# Patient Record
Sex: Female | Born: 1955 | Race: Black or African American | Hispanic: No | Smoking: Former smoker
Health system: Southern US, Community
[De-identification: ages and names within clinical notes are randomized; demographics above are authoritative.]

## PROBLEM LIST (undated history)

## (undated) DIAGNOSIS — J45909 Unspecified asthma, uncomplicated: Secondary | ICD-10-CM

## (undated) DIAGNOSIS — I10 Essential (primary) hypertension: Secondary | ICD-10-CM

## (undated) DIAGNOSIS — M1990 Unspecified osteoarthritis, unspecified site: Secondary | ICD-10-CM

## (undated) DIAGNOSIS — B1920 Unspecified viral hepatitis C without hepatic coma: Secondary | ICD-10-CM

## (undated) DIAGNOSIS — E119 Type 2 diabetes mellitus without complications: Secondary | ICD-10-CM

## (undated) DIAGNOSIS — J449 Chronic obstructive pulmonary disease, unspecified: Secondary | ICD-10-CM

## (undated) HISTORY — PX: TUBAL LIGATION: SHX77

## (undated) HISTORY — PX: ABDOMINAL HYSTERECTOMY: SHX81

---

## 1998-01-17 ENCOUNTER — Encounter: Admission: RE | Admit: 1998-01-17 | Discharge: 1998-01-17 | Payer: Self-pay | Admitting: Sports Medicine

## 1998-06-03 ENCOUNTER — Emergency Department (HOSPITAL_COMMUNITY): Admission: EM | Admit: 1998-06-03 | Discharge: 1998-06-03 | Payer: Self-pay | Admitting: Emergency Medicine

## 1998-06-04 ENCOUNTER — Emergency Department (HOSPITAL_COMMUNITY): Admission: EM | Admit: 1998-06-04 | Discharge: 1998-06-04 | Payer: Self-pay | Admitting: Emergency Medicine

## 1998-06-07 ENCOUNTER — Emergency Department (HOSPITAL_COMMUNITY): Admission: EM | Admit: 1998-06-07 | Discharge: 1998-06-07 | Payer: Self-pay | Admitting: Emergency Medicine

## 2000-10-28 ENCOUNTER — Emergency Department (HOSPITAL_COMMUNITY): Admission: EM | Admit: 2000-10-28 | Discharge: 2000-10-28 | Payer: Self-pay | Admitting: Emergency Medicine

## 2000-11-01 ENCOUNTER — Encounter: Admission: RE | Admit: 2000-11-01 | Discharge: 2000-11-01 | Payer: Self-pay

## 2000-11-11 ENCOUNTER — Encounter: Payer: Self-pay | Admitting: Emergency Medicine

## 2000-11-11 ENCOUNTER — Emergency Department (HOSPITAL_COMMUNITY): Admission: EM | Admit: 2000-11-11 | Discharge: 2000-11-11 | Payer: Self-pay | Admitting: Emergency Medicine

## 2000-11-18 ENCOUNTER — Emergency Department (HOSPITAL_COMMUNITY): Admission: EM | Admit: 2000-11-18 | Discharge: 2000-11-18 | Payer: Self-pay | Admitting: Emergency Medicine

## 2001-01-30 ENCOUNTER — Encounter: Payer: Self-pay | Admitting: Emergency Medicine

## 2001-01-30 ENCOUNTER — Emergency Department (HOSPITAL_COMMUNITY): Admission: EM | Admit: 2001-01-30 | Discharge: 2001-01-30 | Payer: Self-pay | Admitting: Emergency Medicine

## 2005-04-10 ENCOUNTER — Ambulatory Visit: Payer: Self-pay | Admitting: Internal Medicine

## 2005-05-01 ENCOUNTER — Ambulatory Visit (HOSPITAL_COMMUNITY): Admission: RE | Admit: 2005-05-01 | Discharge: 2005-05-01 | Payer: Self-pay | Admitting: Internal Medicine

## 2005-05-29 ENCOUNTER — Ambulatory Visit: Payer: Self-pay | Admitting: Gastroenterology

## 2005-12-06 ENCOUNTER — Ambulatory Visit: Payer: Self-pay | Admitting: Gastroenterology

## 2006-01-08 ENCOUNTER — Ambulatory Visit: Payer: Self-pay | Admitting: Gastroenterology

## 2014-07-07 DIAGNOSIS — Z1231 Encounter for screening mammogram for malignant neoplasm of breast: Secondary | ICD-10-CM | POA: Insufficient documentation

## 2019-10-18 DIAGNOSIS — E119 Type 2 diabetes mellitus without complications: Secondary | ICD-10-CM | POA: Insufficient documentation

## 2019-10-18 DIAGNOSIS — I10 Essential (primary) hypertension: Secondary | ICD-10-CM | POA: Insufficient documentation

## 2019-10-31 DIAGNOSIS — Z90711 Acquired absence of uterus with remaining cervical stump: Secondary | ICD-10-CM | POA: Insufficient documentation

## 2019-10-31 DIAGNOSIS — Z8619 Personal history of other infectious and parasitic diseases: Secondary | ICD-10-CM | POA: Insufficient documentation

## 2020-03-03 DIAGNOSIS — J310 Chronic rhinitis: Secondary | ICD-10-CM | POA: Insufficient documentation

## 2020-03-03 DIAGNOSIS — B351 Tinea unguium: Secondary | ICD-10-CM | POA: Insufficient documentation

## 2020-03-03 DIAGNOSIS — R059 Cough, unspecified: Secondary | ICD-10-CM | POA: Insufficient documentation

## 2020-03-03 DIAGNOSIS — R0600 Dyspnea, unspecified: Secondary | ICD-10-CM | POA: Insufficient documentation

## 2020-03-03 DIAGNOSIS — R0602 Shortness of breath: Secondary | ICD-10-CM | POA: Insufficient documentation

## 2020-03-24 DIAGNOSIS — J432 Centrilobular emphysema: Secondary | ICD-10-CM | POA: Insufficient documentation

## 2020-05-21 ENCOUNTER — Encounter (HOSPITAL_COMMUNITY): Payer: Self-pay | Admitting: Emergency Medicine

## 2020-05-21 ENCOUNTER — Emergency Department (HOSPITAL_COMMUNITY): Payer: BLUE CROSS/BLUE SHIELD

## 2020-05-21 ENCOUNTER — Emergency Department (HOSPITAL_COMMUNITY)
Admission: EM | Admit: 2020-05-21 | Discharge: 2020-05-21 | Disposition: A | Discharge status: Home / Self Care | Payer: BLUE CROSS/BLUE SHIELD | Attending: Emergency Medicine | Admitting: Emergency Medicine

## 2020-05-21 ENCOUNTER — Other Ambulatory Visit: Payer: Self-pay

## 2020-05-21 DIAGNOSIS — R1013 Epigastric pain: Secondary | ICD-10-CM | POA: Diagnosis not present

## 2020-05-21 DIAGNOSIS — R109 Unspecified abdominal pain: Secondary | ICD-10-CM | POA: Diagnosis present

## 2020-05-21 DIAGNOSIS — K8020 Calculus of gallbladder without cholecystitis without obstruction: Secondary | ICD-10-CM | POA: Diagnosis not present

## 2020-05-21 LAB — CBC WITH DIFFERENTIAL/PLATELET
Abs Immature Granulocytes: 0.02 10*3/uL (ref 0.00–0.07)
Basophils Absolute: 0 10*3/uL (ref 0.0–0.1)
Basophils Relative: 1 %
Eosinophils Absolute: 0.4 10*3/uL (ref 0.0–0.5)
Eosinophils Relative: 7 %
HCT: 44.2 % (ref 36.0–46.0)
Hemoglobin: 13.9 g/dL (ref 12.0–15.0)
Immature Granulocytes: 0 %
Lymphocytes Relative: 28 %
Lymphs Abs: 1.5 10*3/uL (ref 0.7–4.0)
MCH: 27.9 pg (ref 26.0–34.0)
MCHC: 31.4 g/dL (ref 30.0–36.0)
MCV: 88.8 fL (ref 80.0–100.0)
Monocytes Absolute: 0.5 10*3/uL (ref 0.1–1.0)
Monocytes Relative: 9 %
Neutro Abs: 2.9 10*3/uL (ref 1.7–7.7)
Neutrophils Relative %: 55 %
Platelets: 199 10*3/uL (ref 150–400)
RBC: 4.98 MIL/uL (ref 3.87–5.11)
RDW: 13.1 % (ref 11.5–15.5)
WBC: 5.3 10*3/uL (ref 4.0–10.5)
nRBC: 0 % (ref 0.0–0.2)

## 2020-05-21 LAB — COMPREHENSIVE METABOLIC PANEL
ALT: 13 U/L (ref 0–44)
AST: 18 U/L (ref 15–41)
Albumin: 4.1 g/dL (ref 3.5–5.0)
Alkaline Phosphatase: 50 U/L (ref 38–126)
Anion gap: 11 (ref 5–15)
BUN: 13 mg/dL (ref 8–23)
CO2: 24 mmol/L (ref 22–32)
Calcium: 9.2 mg/dL (ref 8.9–10.3)
Chloride: 102 mmol/L (ref 98–111)
Creatinine, Ser: 0.85 mg/dL (ref 0.44–1.00)
GFR calc Af Amer: >60 mL/min (ref >60)
GFR calc non Af Amer: >60 mL/min (ref >60)
Glucose, Bld: 193 mg/dL — ABNORMAL HIGH (ref 70–99)
Potassium: 3.6 mmol/L (ref 3.5–5.1)
Sodium: 137 mmol/L (ref 135–145)
Total Bilirubin: 1.2 mg/dL (ref 0.3–1.2)
Total Protein: 7.2 g/dL (ref 6.5–8.1)

## 2020-05-21 LAB — URINALYSIS, ROUTINE W REFLEX MICROSCOPIC
Bilirubin Urine: NEGATIVE
Glucose, UA: 150 mg/dL — AB
Hgb urine dipstick: NEGATIVE
Ketones, ur: NEGATIVE mg/dL
Leukocytes,Ua: NEGATIVE
Nitrite: NEGATIVE
Protein, ur: NEGATIVE mg/dL
Specific Gravity, Urine: 1.021 (ref 1.005–1.030)
pH: 6 (ref 5.0–8.0)

## 2020-05-21 LAB — LIPASE, BLOOD: Lipase: 30 U/L (ref 11–51)

## 2020-05-21 MED ORDER — SUCRALFATE 1 G PO TABS
1.0000 g | ORAL_TABLET | Freq: Three times a day (TID) | ORAL | 0 refills | Status: DC
Start: 2020-05-21 — End: 2022-02-06

## 2020-05-21 MED ORDER — SODIUM CHLORIDE 0.9 % IV BOLUS
1000.0000 mL | Freq: Once | INTRAVENOUS | Status: AC
  Administered 2020-05-21: 1000 mL via INTRAVENOUS

## 2020-05-21 MED ORDER — MORPHINE SULFATE (PF) 4 MG/ML IV SOLN
4.0000 mg | Freq: Once | INTRAVENOUS | Status: AC
  Administered 2020-05-21: 4 mg via INTRAVENOUS
  Filled 2020-05-21: qty 1

## 2020-05-21 MED ORDER — ONDANSETRON 4 MG PO TBDP
4.0000 mg | ORAL_TABLET | Freq: Once | ORAL | Status: AC
  Administered 2020-05-21: 4 mg via ORAL
  Filled 2020-05-21: qty 1

## 2020-05-21 MED ORDER — ONDANSETRON HCL 4 MG/2ML IJ SOLN
4.0000 mg | Freq: Once | INTRAMUSCULAR | Status: AC
  Administered 2020-05-21: 4 mg via INTRAVENOUS
  Filled 2020-05-21: qty 2

## 2020-05-21 MED ORDER — OMEPRAZOLE 20 MG PO CPDR
20.0000 mg | DELAYED_RELEASE_CAPSULE | Freq: Two times a day (BID) | ORAL | 0 refills | Status: AC
Start: 2020-05-21 — End: ?

## 2020-05-21 MED ORDER — ALUM & MAG HYDROXIDE-SIMETH 200-200-20 MG/5ML PO SUSP
30.0000 mL | Freq: Once | ORAL | Status: AC
  Administered 2020-05-21: 30 mL via ORAL
  Filled 2020-05-21: qty 30

## 2020-05-21 MED ORDER — IOHEXOL 300 MG/ML  SOLN
100.0000 mL | Freq: Once | INTRAMUSCULAR | Status: AC | PRN
  Administered 2020-05-21: 100 mL via INTRAVENOUS

## 2020-05-21 NOTE — ED Provider Notes (Signed)
[facility] EMERGENCY DEPARTMENT Provider Note   CSN: [id number] Arrival date & time: 05/21/20  8099     History No chief complaint on file.   Kathleen Orr is a 64 y.o. female.  The history is provided by the patient. No language interpreter was used.     64 year old female presenting for evaluation of abdominal pain.  Patient report last night she ate fried food, around 10 PM when she went to sleep she developed pain to her upper abdomen.  She described pain as a burning aching sensation in her upper abdomen radiates towards her throat.  Pain is been waxing waning, intense, with associated nausea.  She also endorsed vomiting copious amount of time with yellow emesis.  She tries having bowel movement without any success.  She endorsed some chills while vomiting.  She does not complain of any fever, headache, lightheadedness, dizziness, chest pain, shortness of breath, productive cough, hemoptysis, back pain, dysuria.  Denies any exertional chest pain.  She tries drinking ginger ale without relief.  When arrived to the ER and she received antinausea medication her nausea has improved.  She has an intact gallbladder.  She denies alcohol or tobacco use.  She denies any significant cardiac history.  She is up-to-date with her Covid vaccination.  Currently rates the pain a 6 out of 10.  No past medical history on file.  There are no problems to display for this patient.   The histories are not reviewed yet. Please review them in the "History" navigator section and refresh this SmartLink.   OB History   No obstetric history on file.     No family history on file.  Social History   Tobacco Use  . Smoking status: Never Smoker  Substance Use Topics  . Alcohol use: Not on file  . Drug use: Not on file    Home Medications Prior to Admission medications   Not on File    Allergies    Patient has no allergy information on record.  Review of Systems   Review of  Systems  All other systems reviewed and are negative.   Physical Exam Updated Vital Signs BP (!) 152/79 (BP Location: Right Arm)   Pulse 66   Temp 97.6 F (36.4 C) (Oral)   Resp 16   Ht 5\' 5"  (1.651 m)   Wt 91.6 kg   SpO2 100%   BMI 33.61 kg/m   Physical Exam Vitals and nursing note reviewed.  Constitutional:      General: She is not in acute distress.    Appearance: She is well-developed.  HENT:     Head: Atraumatic.  Eyes:     Conjunctiva/sclera: Conjunctivae normal.  Cardiovascular:     Rate and Rhythm: Normal rate and regular rhythm.     Pulses: Normal pulses.     Heart sounds: Normal heart sounds.  Pulmonary:     Effort: Pulmonary effort is normal.     Breath sounds: Normal breath sounds.  Abdominal:     General: Abdomen is flat.     Palpations: Abdomen is soft.     Tenderness: There is abdominal tenderness (Tenderness to epigastric and across upper abdomen on palpation without guarding or rebound tenderness.).     Comments: No pain at McBurney's point.  Musculoskeletal:     Cervical back: Neck supple.  Skin:    Findings: No rash.  Neurological:     Mental Status: She is alert.     ED Results /  Procedures / Treatments   Labs (all labs ordered are listed, but only abnormal results are displayed) Labs Reviewed  COMPREHENSIVE METABOLIC PANEL - Abnormal; Notable for the following components:      Result Value   Glucose, Bld 193 (*)    All other components within normal limits  URINALYSIS, ROUTINE W REFLEX MICROSCOPIC - Abnormal; Notable for the following components:   Glucose, UA 150 (*)    All other components within normal limits  LIPASE, BLOOD  CBC WITH DIFFERENTIAL/PLATELET    EKG None   Date: 05/21/2020  Rate: 68  Rhythm: normal sinus rhythm  QRS Axis: normal  Intervals: normal  ST/T Wave abnormalities: normal  Conduction Disutrbances: none  Narrative Interpretation:   Old EKG Reviewed: No significant changes noted EKG reviewed and  interpreted by me     Radiology CT ABDOMEN PELVIS W CONTRAST  Result Date: 05/21/2020 CLINICAL DATA:  Epigastric and left-sided abdominal pain for 1 day EXAM: CT ABDOMEN AND PELVIS WITH CONTRAST TECHNIQUE: Multidetector CT imaging of the abdomen and pelvis was performed using the standard protocol following bolus administration of intravenous contrast. CONTRAST:  OMNIPAQUE IOHEXOL 300 MG/ML  SOLN COMPARISON:  Same-day ultrasound FINDINGS: Lower chest: No acute abnormality. Hepatobiliary: Multiple layering stones present within the gallbladder lumen. No pericholecystic inflammatory changes by CT. Liver appears unremarkable. No biliary dilatation. Pancreas: Unremarkable. No pancreatic ductal dilatation or surrounding inflammatory changes. Spleen: Normal in size without focal abnormality. Adrenals/Urinary Tract: Unremarkable adrenal glands. Kidneys enhance symmetrically without focal lesion, stone, or hydronephrosis. Ureters are nondilated. Urinary bladder appears unremarkable. Stomach/Bowel: Small sliding type hiatal hernia. Stomach is within normal limits. No dilated loops of bowel to suggest obstruction. Unremarkable retrocecal appendix. There are a few scattered sigmoid diverticula. No focal bowel wall thickening. No pericolonic inflammatory changes. Vascular/Lymphatic: Scattered aortoiliac atherosclerotic calcifications without aneurysm. No abdominopelvic lymphadenopathy. Reproductive: No mass or other abnormality. Other: No free fluid. No abdominopelvic fluid collection. No pneumoperitoneum. Musculoskeletal: No acute or significant osseous findings. IMPRESSION: 1. No acute abdominopelvic findings. 2. Cholelithiasis. 3. Scattered sigmoid diverticulosis without evidence of acute diverticulitis. 4. Small sliding type hiatal hernia. 5. Aortic atherosclerosis. (ICD10-I70.0). Electronically Signed   By: Duanne Guess D.O.   On: 05/21/2020 12:45   US Abdomen Limited  Result Date: 05/21/2020 CLINICAL  DATA:  Abdominal pain, nausea/vomiting x2 days EXAM: ULTRASOUND ABDOMEN LIMITED RIGHT UPPER QUADRANT COMPARISON:  None. FINDINGS: Gallbladder: Multiple gallstones measuring up to 16 mm. No gallbladder wall thickening or pericholecystic fluid. Negative sonographic Murphy's sign. Common bile duct: Diameter: 4 mm Liver: No focal lesion identified. Within normal limits in parenchymal echogenicity. Portal vein is patent on color Doppler imaging with normal direction of blood flow towards the liver. Other: None. IMPRESSION: Cholelithiasis, without associated sonographic findings to suggest acute cholecystitis. Electronically Signed   By: Charline Bills M.D.   On: 05/21/2020 10:43    Procedures Procedures (including critical care time)  Medications Ordered in ED Medications  ondansetron (ZOFRAN-ODT) disintegrating tablet 4 mg (4 mg Oral Given 05/21/20 0550)  alum & mag hydroxide-simeth (MAALOX/MYLANTA) 200-200-20 MG/5ML suspension 30 mL (30 mLs Oral Given 05/21/20 1139)  sodium chloride 0.9 % bolus 1,000 mL (1,000 mLs Intravenous New Bag/Given 05/21/20 1138)  morphine 4 MG/ML injection 4 mg (4 mg Intravenous Given 05/21/20 1144)  ondansetron (ZOFRAN) injection 4 mg (4 mg Intravenous Given 05/21/20 1144)  iohexol (OMNIPAQUE) 300 MG/ML solution 100 mL (100 mLs Intravenous Contrast Given 05/21/20 1154)    ED Course  I have reviewed the  triage vital signs and the nursing notes.  Pertinent labs & imaging results that were available during my care of the patient were reviewed by me and considered in my medical decision making (see chart for details).    MDM Rules/Calculators/A&P                          BP 139/72   Pulse 90   Temp 97.6 F (36.4 C) (Oral)   Resp (!) 23   Ht 5\' 5"  (1.651 m)   Wt 91.6 kg   SpO2 100%   BMI 33.61 kg/m   Final Clinical Impression(s) / ED Diagnoses Final diagnoses:  Epigastric pain  Calculus of gallbladder without cholecystitis without obstruction    Rx / DC Orders ED  Discharge Orders    None     9:26 AM Patient here with upper abdominal pain.  Symptoms suggestive of gastritis.  Possibility of gallbladder etiology as well.  Will obtain limited abdominal ultrasound and will give GI cocktail as well as IV fluid and antinausea medication. Low suspicion for ACS however EKG ordered.  11:03 AM Labs are mostly reassuring, mild hyperglycemia with a CBG of 193.  A limited abdominal ultrasound demonstrated cholelithiasis without evidence of acute cholecystitis.  I suspect her symptoms may likely be gastritis versus potential biliary disease however on reexamination the abdominal pain seems to be more widespread and given her age, and not a definitive finding on her ultrasound, I will obtain an abdominal pelvis CT scan.  1:17 PM Abdominal and pelvis CT scan without any acute abdominal pelvic findings.  Evidence of cholelithiasis as well as scattered sigmoid diverticulosis without evidence of acute diverticulitis.  Small sliding-type hiatal hernia.  At this time patient felt much better with treatment and stable enough to go home.  Encourage patient to avoid fatty greasy food and recommend patient to follow-up with general surgery for discussion of further treatment of cholelithiasis.  Medication prescribed for potential gastritis causing her discomfort.  Return precaution discussed.  Patient voiced understanding and agrees with plan.   , PA-C 05/21/20 1319    07/21/20, MD 05/21/20 [phone]

## 2020-05-21 NOTE — Discharge Instructions (Signed)
Your abdominal pain may be due to to gallstone.  Please avoid eating greasy fatty food as it may precipitate additional pain.  Call and follow-up with surgery for further evaluation.  Take medication prescribed.  Return to the ER if your condition worsen or if you have any other concern.

## 2020-05-21 NOTE — ED Triage Notes (Signed)
Pt here with c/o abd pain that started last night epigastric and to the left side

## 2020-05-30 ENCOUNTER — Encounter (HOSPITAL_COMMUNITY): Payer: Self-pay

## 2020-05-30 ENCOUNTER — Emergency Department (HOSPITAL_COMMUNITY)
Admission: EM | Admit: 2020-05-30 | Discharge: 2020-05-31 | Disposition: A | Discharge status: Home / Self Care | Payer: BLUE CROSS/BLUE SHIELD | Attending: Emergency Medicine | Admitting: Emergency Medicine

## 2020-05-30 ENCOUNTER — Other Ambulatory Visit: Payer: Self-pay

## 2020-05-30 ENCOUNTER — Emergency Department (HOSPITAL_COMMUNITY): Payer: BLUE CROSS/BLUE SHIELD

## 2020-05-30 DIAGNOSIS — Z79899 Other long term (current) drug therapy: Secondary | ICD-10-CM | POA: Insufficient documentation

## 2020-05-30 DIAGNOSIS — R062 Wheezing: Secondary | ICD-10-CM | POA: Insufficient documentation

## 2020-05-30 DIAGNOSIS — R05 Cough: Secondary | ICD-10-CM | POA: Insufficient documentation

## 2020-05-30 DIAGNOSIS — R0602 Shortness of breath: Secondary | ICD-10-CM | POA: Diagnosis not present

## 2020-05-30 DIAGNOSIS — Z20822 Contact with and (suspected) exposure to covid-19: Secondary | ICD-10-CM | POA: Diagnosis not present

## 2020-05-30 LAB — CBC
HCT: 45.4 % (ref 36.0–46.0)
Hemoglobin: 14.3 g/dL (ref 12.0–15.0)
MCH: 28.5 pg (ref 26.0–34.0)
MCHC: 31.5 g/dL (ref 30.0–36.0)
MCV: 90.4 fL (ref 80.0–100.0)
Platelets: 219 10*3/uL (ref 150–400)
RBC: 5.02 MIL/uL (ref 3.87–5.11)
RDW: 13.2 % (ref 11.5–15.5)
WBC: 6.8 10*3/uL (ref 4.0–10.5)
nRBC: 0 % (ref 0.0–0.2)

## 2020-05-30 LAB — TROPONIN I (HIGH SENSITIVITY)
Troponin I (High Sensitivity): 3 ng/L (ref <18)
Troponin I (High Sensitivity): 3 ng/L (ref <18)

## 2020-05-30 LAB — BASIC METABOLIC PANEL
Anion gap: 9 (ref 5–15)
BUN: 11 mg/dL (ref 8–23)
CO2: 29 mmol/L (ref 22–32)
Calcium: 9.5 mg/dL (ref 8.9–10.3)
Chloride: 103 mmol/L (ref 98–111)
Creatinine, Ser: 0.89 mg/dL (ref 0.44–1.00)
GFR calc Af Amer: >60 mL/min (ref >60)
GFR calc non Af Amer: >60 mL/min (ref >60)
Glucose, Bld: 105 mg/dL — ABNORMAL HIGH (ref 70–99)
Potassium: 4.2 mmol/L (ref 3.5–5.1)
Sodium: 141 mmol/L (ref 135–145)

## 2020-05-30 LAB — SARS CORONAVIRUS 2 BY RT PCR (HOSPITAL ORDER, PERFORMED IN ~~LOC~~ HOSPITAL LAB): SARS Coronavirus 2: NEGATIVE

## 2020-05-30 MED ORDER — ALBUTEROL SULFATE HFA 108 (90 BASE) MCG/ACT IN AERS
2.0000 | INHALATION_SPRAY | Freq: Once | RESPIRATORY_TRACT | Status: AC
  Administered 2020-05-30: 2 via RESPIRATORY_TRACT
  Filled 2020-05-30: qty 6.7

## 2020-05-30 NOTE — ED Triage Notes (Signed)
Pt arrives to ED w/ c/o SOB, cough since Friday. Pt denies fever, states she is fully vaccinated against covid. Auditory wheezing noted. Pt denies chest pain.

## 2020-05-31 ENCOUNTER — Emergency Department (HOSPITAL_COMMUNITY): Payer: BLUE CROSS/BLUE SHIELD

## 2020-05-31 LAB — D-DIMER, QUANTITATIVE: D-Dimer, Quant: 1.35 ug/mL-FEU — ABNORMAL HIGH (ref 0.00–0.50)

## 2020-05-31 MED ORDER — PREDNISONE 20 MG PO TABS: 60.0000 mg | ORAL_TABLET | Freq: Once | ORAL | Status: DC

## 2020-05-31 MED ORDER — METHYLPREDNISOLONE SODIUM SUCC 125 MG IJ SOLR
125.0000 mg | Freq: Once | INTRAMUSCULAR | Status: DC
  Filled 2020-05-31: qty 2

## 2020-05-31 MED ORDER — DEXAMETHASONE SODIUM PHOSPHATE 10 MG/ML IJ SOLN
10.0000 mg | Freq: Once | INTRAMUSCULAR | Status: AC
  Administered 2020-05-31: 10 mg via INTRAVENOUS
  Filled 2020-05-31: qty 1

## 2020-05-31 MED ORDER — IPRATROPIUM-ALBUTEROL 0.5-2.5 (3) MG/3ML IN SOLN
3.0000 mL | Freq: Once | RESPIRATORY_TRACT | Status: AC
  Administered 2020-05-31: 3 mL via RESPIRATORY_TRACT
  Filled 2020-05-31: qty 3

## 2020-05-31 MED ORDER — ACETAMINOPHEN 325 MG PO TABS
650.0000 mg | ORAL_TABLET | Freq: Once | ORAL | Status: AC
  Administered 2020-05-31: 650 mg via ORAL
  Filled 2020-05-31: qty 2

## 2020-05-31 MED ORDER — IOHEXOL 350 MG/ML SOLN
75.0000 mL | Freq: Once | INTRAVENOUS | Status: AC | PRN
  Administered 2020-05-31: 75 mL via INTRAVENOUS

## 2020-05-31 NOTE — Discharge Instructions (Addendum)
Your work-up today was overall reassuring.  Please make sure to follow-up with your lung doctor about the symptoms you are experiencing today.  Return to the ER if your symptoms worsen.

## 2020-05-31 NOTE — ED Provider Notes (Signed)
[facility] EMERGENCY DEPARTMENT Provider Note   CSN: [id number] Arrival date & time: 05/30/20  1345     History Chief Complaint  Patient presents with  . Shortness of Breath    Kathleen Orr is a 64 y.o. female.  HPI 64 year old with several days of cough and shortness of breath.  Patient states that she has gradually started to have more wheezing and shortness of breath since Friday.  Endorses smoking, but has not smoked for the last 14 years.  Per chart review, seen a pulmonologist and had PFTs performed.  Given albuterol, states that she has been taking it but recently ran out.  States she picked up a refill yesterday.  She has been vaccinated for Covid.  No fevers or chills.  No chest pain.  No abdominal pain, nausea, vomiting, headache.  No known sick contacts.  Has recently traveled to [place] by car, not on supplemental estrogen.  No history of autoimmune disease.    History reviewed. No pertinent past medical history.  There are no problems to display for this patient.   History reviewed. No pertinent surgical history.   OB History   No obstetric history on file.     No family history on file.  Social History   Tobacco Use  . Smoking status: Never Smoker  Substance Use Topics  . Alcohol use: Not on file  . Drug use: Not on file    Home Medications Prior to Admission medications   Medication Sig Start Date End Date Taking? Authorizing Provider  Fluticasone Propionate (FLONASE ALLERGY RELIEF NA) Place 1 spray into the nose daily as needed.   Yes [provider]  hydrochlorothiazide (HYDRODIURIL) 25 MG tablet Take 25 mg by mouth daily.   Yes [provider]  ibuprofen (ADVIL) 800 MG tablet Take 800 mg by mouth daily as needed.   Yes [provider]  omeprazole (PRILOSEC) 20 MG capsule Take 1 capsule (20 mg total) by mouth 2 (two) times daily before a meal. 05/21/20  Yes Fayrene Helper, PA-C  sucralfate (CARAFATE) 1 g  tablet Take 1 tablet (1 g total) by mouth 4 (four) times daily -  with meals and at bedtime. Patient not taking: Reported on 05/31/2020 05/21/20   Fayrene Helper, PA-C    Allergies    Patient has no allergy information on record.  Review of Systems   Review of Systems  Constitutional: Negative for chills and fever.  HENT: Negative for ear pain and sore throat.   Eyes: Negative for pain and visual disturbance.  Respiratory: Positive for cough, shortness of breath and wheezing.   Cardiovascular: Negative for chest pain and palpitations.  Gastrointestinal: Negative for abdominal pain and vomiting.  Genitourinary: Negative for dysuria and hematuria.  Musculoskeletal: Negative for arthralgias and back pain.  Skin: Negative for color change and rash.  Neurological: Negative for seizures and syncope.  All other systems reviewed and are negative.   Physical Exam Updated Vital Signs BP (!) 155/89   Pulse 76   Temp 98.6 F (37 C) (Oral)   Resp 18   Ht 5\' 5"  (1.651 m)   Wt 91.6 kg   SpO2 95%   BMI 33.60 kg/m   Physical Exam Vitals and nursing note reviewed.  Constitutional:      General: She is not in acute distress.    Appearance: She is well-developed. She is not ill-appearing, toxic-appearing or diaphoretic.  HENT:     Head: Normocephalic and atraumatic.  Eyes:  Conjunctiva/sclera: Conjunctivae normal.  Cardiovascular:     Rate and Rhythm: Normal rate and regular rhythm.     Heart sounds: No murmur heard.   Pulmonary:     Effort: Tachypnea and accessory muscle usage present. No respiratory distress.     Breath sounds: Decreased breath sounds and wheezing present. No rhonchi.  Chest:     Chest wall: No tenderness.  Abdominal:     Palpations: Abdomen is soft.     Tenderness: There is no abdominal tenderness.  Musculoskeletal:     Cervical back: Neck supple.     Right lower leg: No tenderness. No edema.     Left lower leg: No tenderness. No edema.     Comments: Calves  normal bilaterally, no unilateral swelling or redness   Skin:    General: Skin is warm and dry.     Capillary Refill: Capillary refill takes less than 2 seconds.  Neurological:     General: No focal deficit present.     Mental Status: She is alert.  Psychiatric:        Mood and Affect: Mood normal.        Behavior: Behavior normal.     ED Results / Procedures / Treatments   Labs (all labs ordered are listed, but only abnormal results are displayed) Labs Reviewed  BASIC METABOLIC PANEL - Abnormal; Notable for the following components:      Result Value   Glucose, Bld 105 (*)    All other components within normal limits  D-DIMER, QUANTITATIVE (NOT AT [facility]) - Abnormal; Notable for the following components:   D-Dimer, Quant 1.35 (*)    All other components within normal limits  SARS CORONAVIRUS 2 BY RT PCR (HOSPITAL ORDER, PERFORMED IN [facility])  CBC  TROPONIN I (HIGH SENSITIVITY)  TROPONIN I (HIGH SENSITIVITY)    EKG None  Radiology CT Angio Chest PE W and/or Wo Contrast  Result Date: 05/31/2020 CLINICAL DATA:  Shortness of breath, positive D-dimer EXAM: CT ANGIOGRAPHY CHEST WITH CONTRAST TECHNIQUE: Multidetector CT imaging of the chest was performed using the standard protocol during bolus administration of intravenous contrast. Multiplanar CT image reconstructions and MIPs were obtained to evaluate the vascular anatomy. CONTRAST:  77mL OMNIPAQUE IOHEXOL 350 MG/ML SOLN COMPARISON:  None. FINDINGS: Cardiovascular: There is motion artifact particularly at the lung bases. Satisfactory opacification of some of the pulmonary arteries to the segmental level. Segmental branches at the lung bases are not well evaluated. No evidence of pulmonary embolism. Normal heart size. No pericardial effusion. Mediastinum/Nodes: No enlarged lymph nodes identified. Enlarged partially imaged thyroid. Lungs/Pleura: Centrilobular emphysema. No consolidation or mass. Mild bibasilar  subsegmental atelectasis. No pleural effusion or pneumothorax. Upper Abdomen: Small hiatal hernia. Musculoskeletal: No acute osseous abnormality. Review of the MIP images confirms the above findings. IMPRESSION: No evidence of acute pulmonary embolism within limitation of motion artifact. No other acute abnormality. Electronically Signed   By: Guadlupe Spanish M.D.   On: 05/31/2020 14:51   DG Chest Portable 1 View  Result Date: 05/30/2020 CLINICAL DATA:  Cough, wheezing, shortness of breath. EXAM: PORTABLE CHEST 1 VIEW COMPARISON:  Chest x-ray dated March 30, 2015. FINDINGS: The heart size and mediastinal contours are within normal limits. Both lungs are clear. The visualized skeletal structures are unremarkable. IMPRESSION: No active disease. Electronically Signed   By: Obie Dredge M.D.   On: 05/30/2020 14:30    Procedures Procedures (including critical care time)  Medications Ordered in ED Medications  albuterol (  VENTOLIN HFA) 108 (90 Base) MCG/ACT inhaler 2 puff (2 puffs Inhalation Given 05/30/20 1417)  ipratropium-albuterol (DUONEB) 0.5-2.5 (3) MG/3ML nebulizer solution 3 mL (3 mLs Nebulization Given 05/31/20 1012)  acetaminophen (TYLENOL) tablet 650 mg (650 mg Oral Given 05/31/20 1138)  dexamethasone (DECADRON) injection 10 mg (10 mg Intravenous Given 05/31/20 1138)  iohexol (OMNIPAQUE) 350 MG/ML injection 75 mL (75 mLs Intravenous Contrast Given 05/31/20 1418)  ipratropium-albuterol (DUONEB) 0.5-2.5 (3) MG/3ML nebulizer solution 3 mL (3 mLs Nebulization Given 05/31/20 1547)    ED Course  I have reviewed the triage vital signs and the nursing notes.  Pertinent labs & imaging results that were available during my care of the patient were reviewed by me and considered in my medical decision making (see chart for details).    MDM Rules/Calculators/A&P                         Patient presents to the ED with several day history of cough, shortness of breath Presentation, she is alert,  oriented, slightly increased work of breathing, audible wheezing.  Patient is coughing with expiration.  Lung sounds decreased, with wheezes throughout.  Slight accessory muscle usage.  Oxygen saturations between 94 to 95%, not tachypneic or tachycardic.  Abdomen soft and nontender.  Chest wall nontender.  DDX includes The emergent differential diagnosis for shortness of breath includes, but is not limited to, Pulmonary edema, bronchoconstriction, Pneumonia, Pulmonary embolism, Pneumotherax/ Hemothorax, Dysrythmia, ACS.   CBC without leukocytosis, normal hemoglobin.  BMP without any significant electrolyte abnormalities.  Normal renal function.  Delta troponin negative.  Chest x-ray without evidence of pneumonia, pneumothorax, or any other intrathoracic abnormality.  Covid is negative.  However, given diffuse wheezing and no direct diagnosis of COPD, ordered a D-dimer.  This was +1.35.  Follow-up CTA without evidence of PE.  Patient received 8 puffs of albuterol, 2 nebulizer treatments. Reports significant improvement in symptoms. Remains not tachycardic, sats between 95 to 96%.  EKG with normal sinus rhythm  Patient was informed of the results and is overall relieved by her work-up.  Encouraged her to follow-up with her pulmonologist which she agrees to.  Strict return precautions discussed.  Encouraged the patient to use albuterol as needed.  Overall, the patient's been medically screened and is stable for discharge.  Patient was seen and evaluated by Dr. Jeraldine Loots who is agreeable to the above plan and disposition.  Final Clinical Impression(s) / ED Diagnoses Final diagnoses:  Shortness of breath    Rx / DC Orders ED Discharge Orders    None       Leone Brand 05/31/20 1551    Gerhard Munch, MD 06/01/20 [phone]

## 2020-06-04 DIAGNOSIS — K219 Gastro-esophageal reflux disease without esophagitis: Secondary | ICD-10-CM | POA: Insufficient documentation

## 2021-05-30 ENCOUNTER — Emergency Department (HOSPITAL_BASED_OUTPATIENT_CLINIC_OR_DEPARTMENT_OTHER): Payer: Medicare Other

## 2021-05-30 ENCOUNTER — Emergency Department (HOSPITAL_BASED_OUTPATIENT_CLINIC_OR_DEPARTMENT_OTHER)
Admission: EM | Admit: 2021-05-30 | Discharge: 2021-05-30 | Disposition: A | Discharge status: Home / Self Care | Payer: Medicare Other | Attending: Emergency Medicine | Admitting: Emergency Medicine

## 2021-05-30 ENCOUNTER — Other Ambulatory Visit: Payer: Self-pay

## 2021-05-30 ENCOUNTER — Encounter (HOSPITAL_BASED_OUTPATIENT_CLINIC_OR_DEPARTMENT_OTHER): Payer: Self-pay

## 2021-05-30 DIAGNOSIS — I10 Essential (primary) hypertension: Secondary | ICD-10-CM | POA: Insufficient documentation

## 2021-05-30 DIAGNOSIS — M79661 Pain in right lower leg: Secondary | ICD-10-CM | POA: Diagnosis present

## 2021-05-30 DIAGNOSIS — R2241 Localized swelling, mass and lump, right lower limb: Secondary | ICD-10-CM | POA: Diagnosis not present

## 2021-05-30 DIAGNOSIS — E119 Type 2 diabetes mellitus without complications: Secondary | ICD-10-CM | POA: Diagnosis not present

## 2021-05-30 DIAGNOSIS — J45909 Unspecified asthma, uncomplicated: Secondary | ICD-10-CM | POA: Diagnosis not present

## 2021-05-30 DIAGNOSIS — Z79899 Other long term (current) drug therapy: Secondary | ICD-10-CM | POA: Insufficient documentation

## 2021-05-30 DIAGNOSIS — M79604 Pain in right leg: Secondary | ICD-10-CM

## 2021-05-30 DIAGNOSIS — Z87891 Personal history of nicotine dependence: Secondary | ICD-10-CM | POA: Diagnosis not present

## 2021-05-30 HISTORY — DX: Essential (primary) hypertension: I10

## 2021-05-30 HISTORY — DX: Unspecified asthma, uncomplicated: J45.909

## 2021-05-30 HISTORY — DX: Unspecified osteoarthritis, unspecified site: M19.90

## 2021-05-30 HISTORY — DX: Type 2 diabetes mellitus without complications: E11.9

## 2021-05-30 HISTORY — DX: Chronic obstructive pulmonary disease, unspecified: J44.9

## 2021-05-30 HISTORY — DX: Unspecified viral hepatitis C without hepatic coma: B19.20

## 2021-05-30 MED ORDER — METHOCARBAMOL 500 MG PO TABS
500.0000 mg | ORAL_TABLET | Freq: Three times a day (TID) | ORAL | 0 refills | Status: DC | PRN
Start: 2021-05-30 — End: 2022-02-06

## 2021-05-30 NOTE — ED Triage Notes (Signed)
Right leg pain x 4-6 months, Pain begins at medial ankle and radiates up through hip. Sharp shooting pain. Ibuprofen taken last night with no relief. Would like an Xray today

## 2021-05-30 NOTE — ED Notes (Signed)
Patient Alert and oriented to baseline. Stable and ambulatory to baseline. Patient verbalized understanding of the discharge instructions.  Patient belongings were taken by the patient.   

## 2021-05-30 NOTE — ED Provider Notes (Addendum)
[facility] EMERGENCY DEPARTMENT Provider Note   CSN: [id number] Arrival date & time: 05/30/21  0950     History Chief Complaint  Patient presents with   Leg Pain    Kathleen Orr is a 65 y.o. female.   Leg Pain Associated symptoms: no back pain   Patient presents with pain in her right lower extremity.  He has had over the last 6 months.  States it is worse at night.  States actually may get better with walking around.  Goes from the back of the right leg down to the medial aspect of the right calf.  States there is some swelling in the knee.  Some mild pain in the knee also.  Pain is dull.  No rash.  No trauma.  No fevers or chills.  No difficulty breathing.  States the leg feels more swollen too.    Past Medical History:  Diagnosis Date   Arthritis    Asthma    COPD (chronic obstructive pulmonary disease) (HCC)    Diabetes mellitus without complication (HCC)    Hepatitis C test positive    Hypertension     There are no problems to display for this patient.   Past Surgical History:  Procedure Laterality Date   ABDOMINAL HYSTERECTOMY     TUBAL LIGATION       OB History   No obstetric history on file.     History reviewed. No pertinent family history.  Social History   Tobacco Use   Smoking status: Former    Types: Cigarettes   Smokeless tobacco: Never  Substance Use Topics   Alcohol use: Never   Drug use: Not Currently    Home Medications Prior to Admission medications   Medication Sig Start Date End Date Taking? Authorizing Provider  methocarbamol (ROBAXIN) 500 MG tablet Take 1 tablet (500 mg total) by mouth every 8 (eight) hours as needed for muscle spasms. 05/30/21  Yes Benjiman Core, MD  Fluticasone Propionate [facility] ALLERGY RELIEF NA) Place 1 spray into the nose daily as needed.    [provider]  hydrochlorothiazide (HYDRODIURIL) 25 MG tablet Take 25 mg by mouth daily.    [provider]  ibuprofen (ADVIL) 800 MG  tablet Take 800 mg by mouth daily as needed.    [provider]  omeprazole (PRILOSEC) 20 MG capsule Take 1 capsule (20 mg total) by mouth 2 (two) times daily before a meal. 05/21/20   Fayrene Helper, PA-C  sucralfate (CARAFATE) 1 g tablet Take 1 tablet (1 g total) by mouth 4 (four) times daily -  with meals and at bedtime. Patient not taking: Reported on 05/31/2020 05/21/20   Fayrene Helper, PA-C    Allergies    Patient has no allergy information on record.  Review of Systems   Review of Systems  Constitutional:  Negative for appetite change.  HENT:  Negative for congestion.   Respiratory:  Negative for shortness of breath.   Cardiovascular:  Positive for leg swelling. Negative for chest pain.  Gastrointestinal:  Negative for abdominal pain.  Genitourinary:  Negative for flank pain.  Musculoskeletal:  Negative for back pain.  Skin:  Negative for rash.  Neurological:  Negative for weakness.  Psychiatric/Behavioral:  Negative for confusion.    Physical Exam Updated Vital Signs BP (!) 187/104   Pulse (!) 57   Temp 98.3 F (36.8 C) (Oral)   Resp 17   Ht 5\' 5"  (1.651 m)   Wt 89.4 kg  SpO2 98%   BMI 32.78 kg/m   Physical Exam Vitals and nursing note reviewed.  HENT:     Head: Atraumatic.  Cardiovascular:     Rate and Rhythm: Regular rhythm.  Pulmonary:     Breath sounds: No wheezing or rhonchi.  Abdominal:     Tenderness: There is no abdominal tenderness.  Musculoskeletal:        General: Tenderness present.     Cervical back: Neck supple.     Comments: Mild tenderness over right knee medially.  No deformity.  Good range of motion.  Tenderness to right SI area.  Good range of motion hip.  May have mild edema in leg.  Extremitie is of normal temperature.  No skin changes  Neurological:     Mental Status: She is alert.    ED Results / Procedures / Treatments   Labs (all labs ordered are listed, but only abnormal results are displayed) Labs Reviewed - No data to  display  EKG None  Radiology US Venous Img Lower Unilateral Right  Result Date: 05/30/2021 CLINICAL DATA:  Right lower extremity pain for 2 months. EXAM: Right LOWER EXTREMITY VENOUS DOPPLER ULTRASOUND TECHNIQUE: Gray-scale sonography with compression, as well as color and duplex ultrasound, were performed to evaluate the deep venous system(s) from the level of the common femoral vein through the popliteal and proximal calf veins. COMPARISON:  None. FINDINGS: VENOUS Normal compressibility of the common femoral, superficial femoral, and popliteal veins, as well as the visualized calf veins. Visualized portions of profunda femoral vein and great saphenous vein unremarkable. No filling defects to suggest DVT on grayscale or color Doppler imaging. Doppler waveforms show normal direction of venous flow, normal respiratory plasticity and response to augmentation. Limited views of the contralateral common femoral vein are unremarkable. OTHER None. Limitations: none IMPRESSION: Negative. Electronically Signed   By: Lupita Raider M.D.   On: 05/30/2021 10:57   DG Knee Complete 4 Views Right  Result Date: 05/30/2021 CLINICAL DATA:  Pain EXAM: RIGHT KNEE - COMPLETE 4+ VIEW; DG HIP (WITH OR WITHOUT PELVIS) 2-3V RIGHT COMPARISON:  None. FINDINGS: Pelvis: Single AP view of the pelvis demonstrates mild degenerative changes of the SI joints and pubic symphysis. No acute osseous abnormalities. Right hip: No acute fracture or dislocation. Joint spaces well maintained. Mild vascular calcifications. Right knee: No acute fracture or dislocation. Mild degenerative changes of the lateral and patellofemoral compartment consisting of osteophyte formation. No significant joint effusion. IMPRESSION: No acute osseous abnormality. Mild degenerative changes of the lateral and patellofemoral compartment of the right knee. Electronically Signed   By: Allegra Lai M.D.   On: 05/30/2021 10:49   DG Hip Unilat W or Wo Pelvis 2-3  Views Right  Result Date: 05/30/2021 CLINICAL DATA:  Pain EXAM: RIGHT KNEE - COMPLETE 4+ VIEW; DG HIP (WITH OR WITHOUT PELVIS) 2-3V RIGHT COMPARISON:  None. FINDINGS: Pelvis: Single AP view of the pelvis demonstrates mild degenerative changes of the SI joints and pubic symphysis. No acute osseous abnormalities. Right hip: No acute fracture or dislocation. Joint spaces well maintained. Mild vascular calcifications. Right knee: No acute fracture or dislocation. Mild degenerative changes of the lateral and patellofemoral compartment consisting of osteophyte formation. No significant joint effusion. IMPRESSION: No acute osseous abnormality. Mild degenerative changes of the lateral and patellofemoral compartment of the right knee. Electronically Signed   By: Allegra Lai M.D.   On: 05/30/2021 10:49    Procedures Procedures   Medications Ordered in ED Medications -  No data to display  ED Course  I have reviewed the triage vital signs and the nursing notes.  Pertinent labs & imaging results that were available during my care of the patient were reviewed by me and considered in my medical decision making (see chart for details).    MDM Rules/Calculators/A&P                           Patient pain in right lower extremity.  Hip knee and some swelling.  Imaging reassuring.  Pain is worse at night.  States she has trouble sleeping.  Better with walking.  Will treat with muscle relaxer.  Imaging reassuring.  Discharge home with sports medicine follow-up as needed.  Doubt arterial occlusion.  Doubt DVT. Patient was also hypertensive.  She is on blood pressure medicines.  Has a PCP to follow-up with.  Do not think we need further work-up at this time but will have short-term follow-up Final Clinical Impression(s) / ED Diagnoses Final diagnoses:  Pain of right lower extremity    Rx / DC Orders ED Discharge Orders          Ordered    methocarbamol (ROBAXIN) 500 MG tablet  Every 8 hours PRN         05/30/21 1211             Benjiman Core, MD 05/30/21 1213    Benjiman Core, MD 05/30/21 1216

## 2021-05-30 NOTE — ED Notes (Signed)
Pt ambulatory to the restroom without difficulty. Gait steady and even.  

## 2021-06-16 ENCOUNTER — Ambulatory Visit: Payer: Medicare Other | Admitting: Family Medicine

## 2021-06-16 NOTE — Progress Notes (Deleted)
  Kathleen Orr - 64 y.o. female MRN [id number]  Date of birth: [birth year]  SUBJECTIVE:  Including CC & ROS.  No chief complaint on file.   Kathleen Orr is a 65 y.o. female that is  ***.  ***   Review of Systems See HPI   HISTORY: Past Medical, Surgical, Social, and Family History Reviewed & Updated per EMR.   Pertinent Historical Findings include:  Past Medical History:  Diagnosis Date   Arthritis    Asthma    COPD (chronic obstructive pulmonary disease) (HCC)    Diabetes mellitus without complication (HCC)    Hepatitis C test positive    Hypertension     Past Surgical History:  Procedure Laterality Date   ABDOMINAL HYSTERECTOMY     TUBAL LIGATION      No family history on file.  Social History   Socioeconomic History   Marital status: Divorced    Spouse name: Not on file   Number of children: Not on file   Years of education: Not on file   Highest education level: Not on file  Occupational History   Not on file  Tobacco Use   Smoking status: Former    Types: Cigarettes   Smokeless tobacco: Never  Substance and Sexual Activity   Alcohol use: Never   Drug use: Not Currently   Sexual activity: Not on file  Other Topics Concern   Not on file  Social History Narrative   Not on file   Social Determinants of Health   Financial Resource Strain: Not on file  Food Insecurity: Not on file  Transportation Needs: Not on file  Physical Activity: Not on file  Stress: Not on file  Social Connections: Not on file  Intimate Partner Violence: Not on file     PHYSICAL EXAM:  VS: There were no vitals taken for this visit. Physical Exam Gen: NAD, alert, cooperative with exam, well-appearing MSK:  ***      ASSESSMENT & PLAN:   No problem-specific Assessment & Plan notes found for this encounter.

## 2021-08-28 ENCOUNTER — Emergency Department (HOSPITAL_BASED_OUTPATIENT_CLINIC_OR_DEPARTMENT_OTHER): Payer: Medicare Other

## 2021-08-28 ENCOUNTER — Encounter (HOSPITAL_BASED_OUTPATIENT_CLINIC_OR_DEPARTMENT_OTHER): Payer: Self-pay

## 2021-08-28 ENCOUNTER — Emergency Department (HOSPITAL_BASED_OUTPATIENT_CLINIC_OR_DEPARTMENT_OTHER)
Admission: EM | Admit: 2021-08-28 | Discharge: 2021-08-28 | Disposition: A | Discharge status: Home / Self Care | Payer: Medicare Other | Attending: Emergency Medicine | Admitting: Emergency Medicine

## 2021-08-28 DIAGNOSIS — E119 Type 2 diabetes mellitus without complications: Secondary | ICD-10-CM | POA: Insufficient documentation

## 2021-08-28 DIAGNOSIS — Z87891 Personal history of nicotine dependence: Secondary | ICD-10-CM | POA: Diagnosis not present

## 2021-08-28 DIAGNOSIS — I10 Essential (primary) hypertension: Secondary | ICD-10-CM | POA: Diagnosis not present

## 2021-08-28 DIAGNOSIS — R0789 Other chest pain: Secondary | ICD-10-CM | POA: Insufficient documentation

## 2021-08-28 DIAGNOSIS — Z79899 Other long term (current) drug therapy: Secondary | ICD-10-CM | POA: Insufficient documentation

## 2021-08-28 DIAGNOSIS — R079 Chest pain, unspecified: Secondary | ICD-10-CM

## 2021-08-28 DIAGNOSIS — J45909 Unspecified asthma, uncomplicated: Secondary | ICD-10-CM | POA: Insufficient documentation

## 2021-08-28 DIAGNOSIS — J449 Chronic obstructive pulmonary disease, unspecified: Secondary | ICD-10-CM | POA: Insufficient documentation

## 2021-08-28 LAB — CBC WITH DIFFERENTIAL/PLATELET
Abs Immature Granulocytes: 0.01 10*3/uL (ref 0.00–0.07)
Basophils Absolute: 0 10*3/uL (ref 0.0–0.1)
Basophils Relative: 0 %
Eosinophils Absolute: 0.2 10*3/uL (ref 0.0–0.5)
Eosinophils Relative: 5 %
HCT: 45.5 % (ref 36.0–46.0)
Hemoglobin: 14.5 g/dL (ref 12.0–15.0)
Immature Granulocytes: 0 %
Lymphocytes Relative: 39 %
Lymphs Abs: 1.8 10*3/uL (ref 0.7–4.0)
MCH: 28.3 pg (ref 26.0–34.0)
MCHC: 31.9 g/dL (ref 30.0–36.0)
MCV: 88.7 fL (ref 80.0–100.0)
Monocytes Absolute: 0.4 10*3/uL (ref 0.1–1.0)
Monocytes Relative: 10 %
Neutro Abs: 2 10*3/uL (ref 1.7–7.7)
Neutrophils Relative %: 46 %
Platelets: 205 10*3/uL (ref 150–400)
RBC: 5.13 MIL/uL — ABNORMAL HIGH (ref 3.87–5.11)
RDW: 13.2 % (ref 11.5–15.5)
WBC: 4.5 10*3/uL (ref 4.0–10.5)
nRBC: 0 % (ref 0.0–0.2)

## 2021-08-28 LAB — TROPONIN I (HIGH SENSITIVITY)
Troponin I (High Sensitivity): 4 ng/L (ref <18)
Troponin I (High Sensitivity): 4 ng/L (ref <18)

## 2021-08-28 LAB — BASIC METABOLIC PANEL
Anion gap: 10 (ref 5–15)
BUN: 13 mg/dL (ref 8–23)
CO2: 26 mmol/L (ref 22–32)
Calcium: 9.3 mg/dL (ref 8.9–10.3)
Chloride: 104 mmol/L (ref 98–111)
Creatinine, Ser: 0.82 mg/dL (ref 0.44–1.00)
GFR, Estimated: >60 mL/min (ref >60)
Glucose, Bld: 123 mg/dL — ABNORMAL HIGH (ref 70–99)
Potassium: 3.6 mmol/L (ref 3.5–5.1)
Sodium: 140 mmol/L (ref 135–145)

## 2021-08-28 MED ORDER — IBUPROFEN 400 MG PO TABS
600.0000 mg | ORAL_TABLET | Freq: Once | ORAL | Status: AC
  Administered 2021-08-28: 600 mg via ORAL
  Filled 2021-08-28: qty 1

## 2021-08-28 MED ORDER — ACETAMINOPHEN 325 MG PO TABS
650.0000 mg | ORAL_TABLET | Freq: Once | ORAL | Status: AC
  Administered 2021-08-28: 650 mg via ORAL
  Filled 2021-08-28: qty 2

## 2021-08-28 NOTE — ED Notes (Addendum)
D/c paperwork reviewed with pt, including f/u care. Pt informed of elevated BP, encouraged to f/u with cardiologist in the next week. Pt verbalized understanding, no questions or concerns at time of d/c. Pt declined offer for wheelchair, reports she prefers to walk out.

## 2021-08-28 NOTE — ED Provider Notes (Signed)
[facility] EMERGENCY DEPARTMENT Provider Note   CSN: [id number] Arrival date & time: 08/28/21  3790     History Chief Complaint  Patient presents with   Chest Pain    Kathleen Orr is a 65 y.o. female.  Patient presents with complaint of chest pain.  Describes it as mid to right-sided chest pain.  Describes a sharp otherwise nonradiating.  Worse when she twists her chest a certain way or strains her arm to try to push herself off her bed.  At rest the pain is nearly gone.  She has been having the symptoms for the past 2 days.  Denies recent fall or trauma.  Denies fevers or cough or vomiting or diarrhea.  Denies shortness of breath.      Past Medical History:  Diagnosis Date   Arthritis    Asthma    COPD (chronic obstructive pulmonary disease) (HCC)    Diabetes mellitus without complication (HCC)    Hepatitis C test positive    Hypertension     There are no problems to display for this patient.   Past Surgical History:  Procedure Laterality Date   ABDOMINAL HYSTERECTOMY     TUBAL LIGATION       OB History   No obstetric history on file.     History reviewed. No pertinent family history.  Social History   Tobacco Use   Smoking status: Former    Types: Cigarettes   Smokeless tobacco: Never  Substance Use Topics   Alcohol use: Never   Drug use: Not Currently    Home Medications Prior to Admission medications   Medication Sig Start Date End Date Taking? Authorizing Provider  Fluticasone Propionate (FLONASE ALLERGY RELIEF NA) Place 1 spray into the nose daily as needed.    [provider]  hydrochlorothiazide (HYDRODIURIL) 25 MG tablet Take 25 mg by mouth daily.    [provider]  ibuprofen (ADVIL) 800 MG tablet Take 800 mg by mouth daily as needed.    [provider]  methocarbamol (ROBAXIN) 500 MG tablet Take 1 tablet (500 mg total) by mouth every 8 (eight) hours as needed for muscle spasms. 05/30/21   Benjiman Core, MD  omeprazole (PRILOSEC) 20 MG capsule Take 1 capsule (20 mg total) by mouth 2 (two) times daily before a meal. 05/21/20   Fayrene Helper, PA-C  sucralfate (CARAFATE) 1 g tablet Take 1 tablet (1 g total) by mouth 4 (four) times daily -  with meals and at bedtime. Patient not taking: Reported on 05/31/2020 05/21/20   Fayrene Helper, PA-C    Allergies    Patient has no known allergies.  Review of Systems   Review of Systems  Constitutional:  Negative for fever.  HENT:  Negative for ear pain.   Eyes:  Negative for pain.  Respiratory:  Negative for cough.   Cardiovascular:  Positive for chest pain.  Gastrointestinal:  Negative for abdominal pain.  Genitourinary:  Negative for flank pain.  Musculoskeletal:  Negative for back pain.  Skin:  Negative for rash.  Neurological:  Negative for headaches.   Physical Exam Updated Vital Signs BP (!) 172/95   Pulse (!) 54   Temp 97.9 F (36.6 C) (Oral)   Resp 20   Ht 5\' 5"  (1.651 m)   Wt 88.5 kg   SpO2 97%   BMI 32.45 kg/m   Physical Exam Constitutional:      General: She is not in acute distress.    Appearance: Normal  appearance.  HENT:     Head: Normocephalic.     Nose: Nose normal.  Eyes:     Extraocular Movements: Extraocular movements intact.  Cardiovascular:     Rate and Rhythm: Normal rate.  Pulmonary:     Effort: Pulmonary effort is normal.  Musculoskeletal:        General: Normal range of motion.     Cervical back: Normal range of motion.     Right lower leg: No edema.     Left lower leg: No edema.  Neurological:     General: No focal deficit present.     Mental Status: She is alert. Mental status is at baseline.    ED Results / Procedures / Treatments   Labs (all labs ordered are listed, but only abnormal results are displayed) Labs Reviewed  CBC WITH DIFFERENTIAL/PLATELET - Abnormal; Notable for the following components:      Result Value   RBC 5.13 (*)    All other components within normal limits  BASIC  METABOLIC PANEL - Abnormal; Notable for the following components:   Glucose, Bld 123 (*)    All other components within normal limits  TROPONIN I (HIGH SENSITIVITY)  TROPONIN I (HIGH SENSITIVITY)    EKG EKG Interpretation  Date/Time:  Monday August 28 2021 09:04:10 EST Ventricular Rate:  63 PR Interval:  163 QRS Duration: 92 QT Interval:  435 QTC Calculation: 446 R Axis:   -18 Text Interpretation: Sinus rhythm Left ventricular hypertrophy Confirmed by Norman Clay ([id number]) on 08/28/2021 9:08:06 AM  Radiology DG Chest Port 1 View  Result Date: 08/28/2021 CLINICAL DATA:  Midsternal chest pain EXAM: PORTABLE CHEST 1 VIEW COMPARISON:  None. FINDINGS: The heart size and mediastinal contours are within normal limits. Both lungs are clear. The visualized skeletal structures are unremarkable. IMPRESSION: No active disease. Electronically Signed   By: Larose Hires D.O.   On: 08/28/2021 09:51    Procedures Procedures   Medications Ordered in ED Medications  acetaminophen (TYLENOL) tablet 650 mg (650 mg Oral Given 08/28/21 1020)  ibuprofen (ADVIL) tablet 600 mg (600 mg Oral Given 08/28/21 1020)    ED Course  I have reviewed the triage vital signs and the nursing notes.  Pertinent labs & imaging results that were available during my care of the patient were reviewed by me and considered in my medical decision making (see chart for details).    MDM Rules/Calculators/A&P                           Labs unremarkable.  EKG shows sinus rhythm, no ST elevations no ST depressions normal sinus rhythm noted.  Clinically doubt acute coronary syndrome given history and exam.   2 sets of troponin sent both are negative.  Advising outpatient follow-up with cardiology within the week, recommending immediate return for worsening symptoms fevers pain or any additional concerns.   Final Clinical Impression(s) / ED Diagnoses Final diagnoses:  Chest pain, unspecified type    Rx / DC  Orders ED Discharge Orders     None        Cheryll Cockayne, MD 08/28/21 1303

## 2021-08-28 NOTE — ED Triage Notes (Signed)
Pt c/o mid sternal chest pain intermittently since Friday. States pain worsens when trying to get up from a lying position. Also c/o shortness of breath, worse with exertion.

## 2021-08-28 NOTE — Discharge Instructions (Signed)
Call your primary care doctor or specialist as discussed in the next 2-3 days.   Return immediately back to the ER if:  Your symptoms worsen within the next 12-24 hours. You develop new symptoms such as new fevers, persistent vomiting, new pain, shortness of breath, or new weakness or numbness, or if you have any other concerns.  

## 2021-08-28 NOTE — ED Notes (Signed)
Pt reports use muscle relaxer last night states that it helped but she could tell when it wore off.

## 2021-08-28 NOTE — ED Notes (Signed)
ED Provider at bedside. 

## 2021-09-15 ENCOUNTER — Encounter (HOSPITAL_BASED_OUTPATIENT_CLINIC_OR_DEPARTMENT_OTHER): Payer: Self-pay

## 2021-09-15 ENCOUNTER — Emergency Department (HOSPITAL_BASED_OUTPATIENT_CLINIC_OR_DEPARTMENT_OTHER)
Admission: EM | Admit: 2021-09-15 | Discharge: 2021-09-15 | Disposition: A | Discharge status: Home / Self Care | Payer: Medicare Other | Attending: Emergency Medicine | Admitting: Emergency Medicine

## 2021-09-15 ENCOUNTER — Emergency Department (HOSPITAL_BASED_OUTPATIENT_CLINIC_OR_DEPARTMENT_OTHER): Payer: Medicare Other

## 2021-09-15 ENCOUNTER — Other Ambulatory Visit: Payer: Self-pay

## 2021-09-15 ENCOUNTER — Other Ambulatory Visit: Payer: Self-pay | Admitting: Family

## 2021-09-15 DIAGNOSIS — Z79899 Other long term (current) drug therapy: Secondary | ICD-10-CM | POA: Diagnosis not present

## 2021-09-15 DIAGNOSIS — I10 Essential (primary) hypertension: Secondary | ICD-10-CM | POA: Diagnosis not present

## 2021-09-15 DIAGNOSIS — J449 Chronic obstructive pulmonary disease, unspecified: Secondary | ICD-10-CM | POA: Insufficient documentation

## 2021-09-15 DIAGNOSIS — M79651 Pain in right thigh: Secondary | ICD-10-CM | POA: Insufficient documentation

## 2021-09-15 DIAGNOSIS — S299XXA Unspecified injury of thorax, initial encounter: Secondary | ICD-10-CM | POA: Diagnosis not present

## 2021-09-15 DIAGNOSIS — T1490XA Injury, unspecified, initial encounter: Secondary | ICD-10-CM

## 2021-09-15 DIAGNOSIS — Z87891 Personal history of nicotine dependence: Secondary | ICD-10-CM | POA: Diagnosis not present

## 2021-09-15 DIAGNOSIS — E119 Type 2 diabetes mellitus without complications: Secondary | ICD-10-CM | POA: Insufficient documentation

## 2021-09-15 DIAGNOSIS — S298XXA Other specified injuries of thorax, initial encounter: Secondary | ICD-10-CM

## 2021-09-15 DIAGNOSIS — M25561 Pain in right knee: Secondary | ICD-10-CM

## 2021-09-15 DIAGNOSIS — J45909 Unspecified asthma, uncomplicated: Secondary | ICD-10-CM | POA: Insufficient documentation

## 2021-09-15 DIAGNOSIS — S3991XA Unspecified injury of abdomen, initial encounter: Secondary | ICD-10-CM | POA: Diagnosis not present

## 2021-09-15 LAB — BASIC METABOLIC PANEL
Anion gap: 8 (ref 5–15)
BUN: 20 mg/dL (ref 8–23)
CO2: 24 mmol/L (ref 22–32)
Calcium: 8.8 mg/dL — ABNORMAL LOW (ref 8.9–10.3)
Chloride: 104 mmol/L (ref 98–111)
Creatinine, Ser: 0.94 mg/dL (ref 0.44–1.00)
GFR, Estimated: >60 mL/min (ref >60)
Glucose, Bld: 111 mg/dL — ABNORMAL HIGH (ref 70–99)
Potassium: 3.9 mmol/L (ref 3.5–5.1)
Sodium: 136 mmol/L (ref 135–145)

## 2021-09-15 LAB — CBC WITH DIFFERENTIAL/PLATELET
Abs Immature Granulocytes: 0.03 10*3/uL (ref 0.00–0.07)
Basophils Absolute: 0 10*3/uL (ref 0.0–0.1)
Basophils Relative: 1 %
Eosinophils Absolute: 0.3 10*3/uL (ref 0.0–0.5)
Eosinophils Relative: 5 %
HCT: 42 % (ref 36.0–46.0)
Hemoglobin: 13.7 g/dL (ref 12.0–15.0)
Immature Granulocytes: 1 %
Lymphocytes Relative: 33 %
Lymphs Abs: 2.1 10*3/uL (ref 0.7–4.0)
MCH: 28.4 pg (ref 26.0–34.0)
MCHC: 32.6 g/dL (ref 30.0–36.0)
MCV: 87 fL (ref 80.0–100.0)
Monocytes Absolute: 0.5 10*3/uL (ref 0.1–1.0)
Monocytes Relative: 8 %
Neutro Abs: 3.3 10*3/uL (ref 1.7–7.7)
Neutrophils Relative %: 52 %
Platelets: 255 10*3/uL (ref 150–400)
RBC: 4.83 MIL/uL (ref 3.87–5.11)
RDW: 13.2 % (ref 11.5–15.5)
WBC: 6.2 10*3/uL (ref 4.0–10.5)
nRBC: 0 % (ref 0.0–0.2)

## 2021-09-15 MED ORDER — IOHEXOL 300 MG/ML  SOLN
100.0000 mL | Freq: Once | INTRAMUSCULAR | Status: AC | PRN
  Administered 2021-09-15: 100 mL via INTRAVENOUS

## 2021-09-15 NOTE — Discharge Instructions (Addendum)
You can use your muscle relaxer as needed to help with the pains.  Follow-up with your doctor as needed
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
## 2021-09-15 NOTE — ED Notes (Signed)
No acute distress noted upon this RN's departure of patient. Verified discharge paperwork with name and DOB. Vital signs stable. Patient taken to checkout window. Discharge paperwork discussed with patient. Patient refused WC. - Patient ambulated without difficulty or assistance. No further questions voiced upon discharge.
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
## 2021-09-22 ENCOUNTER — Ambulatory Visit
Admission: RE | Admit: 2021-09-22 | Discharge: 2021-09-22 | Disposition: A | Discharge status: Home / Self Care | Payer: Medicare Other | Source: Ambulatory Visit | Attending: Student | Admitting: Student

## 2021-09-22 ENCOUNTER — Other Ambulatory Visit: Payer: Self-pay | Admitting: Student

## 2021-09-22 DIAGNOSIS — M5440 Lumbago with sciatica, unspecified side: Secondary | ICD-10-CM

## 2021-10-12 ENCOUNTER — Other Ambulatory Visit: Payer: Medicare Other

## 2021-11-03 ENCOUNTER — Other Ambulatory Visit: Payer: Self-pay

## 2021-11-03 ENCOUNTER — Ambulatory Visit
Admission: RE | Admit: 2021-11-03 | Discharge: 2021-11-03 | Disposition: A | Discharge status: Home / Self Care | Payer: Medicare Other | Source: Ambulatory Visit | Attending: Family | Admitting: Family

## 2021-11-03 DIAGNOSIS — M25561 Pain in right knee: Secondary | ICD-10-CM

## 2022-02-04 ENCOUNTER — Other Ambulatory Visit: Payer: Self-pay

## 2022-02-04 ENCOUNTER — Encounter (HOSPITAL_BASED_OUTPATIENT_CLINIC_OR_DEPARTMENT_OTHER): Payer: Self-pay

## 2022-02-04 DIAGNOSIS — J45909 Unspecified asthma, uncomplicated: Secondary | ICD-10-CM | POA: Diagnosis not present

## 2022-02-04 DIAGNOSIS — J449 Chronic obstructive pulmonary disease, unspecified: Secondary | ICD-10-CM | POA: Diagnosis not present

## 2022-02-04 DIAGNOSIS — E119 Type 2 diabetes mellitus without complications: Secondary | ICD-10-CM | POA: Diagnosis not present

## 2022-02-04 DIAGNOSIS — Z87891 Personal history of nicotine dependence: Secondary | ICD-10-CM | POA: Insufficient documentation

## 2022-02-04 DIAGNOSIS — R109 Unspecified abdominal pain: Secondary | ICD-10-CM | POA: Diagnosis present

## 2022-02-04 DIAGNOSIS — K8000 Calculus of gallbladder with acute cholecystitis without obstruction: Secondary | ICD-10-CM | POA: Diagnosis not present

## 2022-02-04 DIAGNOSIS — I10 Essential (primary) hypertension: Secondary | ICD-10-CM | POA: Diagnosis not present

## 2022-02-04 LAB — COMPREHENSIVE METABOLIC PANEL WITH GFR
ALT: 16 U/L (ref 0–44)
AST: 23 U/L (ref 15–41)
Albumin: 4.3 g/dL (ref 3.5–5.0)
Alkaline Phosphatase: 90 U/L (ref 38–126)
Anion gap: 8 (ref 5–15)
BUN: 11 mg/dL (ref 8–23)
CO2: 24 mmol/L (ref 22–32)
Calcium: 9.5 mg/dL (ref 8.9–10.3)
Chloride: 104 mmol/L (ref 98–111)
Creatinine, Ser: 0.76 mg/dL (ref 0.44–1.00)
GFR, Estimated: >60 mL/min (ref >60)
Glucose, Bld: 145 mg/dL — ABNORMAL HIGH (ref 70–99)
Potassium: 3.5 mmol/L (ref 3.5–5.1)
Sodium: 136 mmol/L (ref 135–145)
Total Bilirubin: 0.8 mg/dL (ref 0.3–1.2)
Total Protein: 7.8 g/dL (ref 6.5–8.1)

## 2022-02-04 LAB — LIPASE, BLOOD: Lipase: 28 U/L (ref 11–51)

## 2022-02-04 LAB — CBC
HCT: 43.7 % (ref 36.0–46.0)
Hemoglobin: 14.5 g/dL (ref 12.0–15.0)
MCH: 28.2 pg (ref 26.0–34.0)
MCHC: 33.2 g/dL (ref 30.0–36.0)
MCV: 85 fL (ref 80.0–100.0)
Platelets: 218 K/uL (ref 150–400)
RBC: 5.14 MIL/uL — ABNORMAL HIGH (ref 3.87–5.11)
RDW: 12.5 % (ref 11.5–15.5)
WBC: 7.7 K/uL (ref 4.0–10.5)
nRBC: 0 % (ref 0.0–0.2)

## 2022-02-04 LAB — TROPONIN I (HIGH SENSITIVITY): Troponin I (High Sensitivity): 4 ng/L (ref <18)

## 2022-02-04 NOTE — ED Notes (Signed)
Will call EKG office to fix the EKG mistake. ?

## 2022-02-04 NOTE — ED Triage Notes (Signed)
Pt c/o 8/10 burning epigastric ABD pain since this morning. States she has GERD but it does not feel completely like it. +pain on palp to epigastric, +n/v x 1. Denies CP, SOB.  ?

## 2022-02-05 ENCOUNTER — Encounter (HOSPITAL_COMMUNITY)
Admission: EM | Disposition: A | Discharge status: Home / Self Care | Payer: Self-pay | Source: Home / Self Care | Attending: Emergency Medicine

## 2022-02-05 ENCOUNTER — Emergency Department (HOSPITAL_BASED_OUTPATIENT_CLINIC_OR_DEPARTMENT_OTHER): Payer: Medicare Other

## 2022-02-05 ENCOUNTER — Observation Stay (HOSPITAL_BASED_OUTPATIENT_CLINIC_OR_DEPARTMENT_OTHER)
Admission: EM | Admit: 2022-02-05 | Discharge: 2022-02-06 | Disposition: A | Discharge status: Home / Self Care | Payer: Medicare Other | Attending: Surgery | Admitting: Surgery

## 2022-02-05 ENCOUNTER — Observation Stay (HOSPITAL_BASED_OUTPATIENT_CLINIC_OR_DEPARTMENT_OTHER): Payer: Medicare Other | Admitting: Certified Registered"

## 2022-02-05 ENCOUNTER — Observation Stay (HOSPITAL_COMMUNITY): Payer: Medicare Other | Admitting: Certified Registered"

## 2022-02-05 ENCOUNTER — Observation Stay (HOSPITAL_COMMUNITY): Payer: Medicare Other

## 2022-02-05 ENCOUNTER — Emergency Department (HOSPITAL_COMMUNITY): Payer: Medicare Other

## 2022-02-05 ENCOUNTER — Encounter (HOSPITAL_BASED_OUTPATIENT_CLINIC_OR_DEPARTMENT_OTHER): Payer: Self-pay

## 2022-02-05 ENCOUNTER — Other Ambulatory Visit: Payer: Self-pay

## 2022-02-05 DIAGNOSIS — K8000 Calculus of gallbladder with acute cholecystitis without obstruction: Secondary | ICD-10-CM

## 2022-02-05 DIAGNOSIS — J449 Chronic obstructive pulmonary disease, unspecified: Secondary | ICD-10-CM | POA: Diagnosis not present

## 2022-02-05 DIAGNOSIS — E119 Type 2 diabetes mellitus without complications: Secondary | ICD-10-CM | POA: Diagnosis not present

## 2022-02-05 DIAGNOSIS — K8010 Calculus of gallbladder with chronic cholecystitis without obstruction: Secondary | ICD-10-CM | POA: Diagnosis present

## 2022-02-05 DIAGNOSIS — I10 Essential (primary) hypertension: Secondary | ICD-10-CM | POA: Diagnosis not present

## 2022-02-05 DIAGNOSIS — J45909 Unspecified asthma, uncomplicated: Secondary | ICD-10-CM | POA: Diagnosis not present

## 2022-02-05 DIAGNOSIS — K8020 Calculus of gallbladder without cholecystitis without obstruction: Secondary | ICD-10-CM

## 2022-02-05 DIAGNOSIS — Z87891 Personal history of nicotine dependence: Secondary | ICD-10-CM | POA: Diagnosis not present

## 2022-02-05 DIAGNOSIS — R109 Unspecified abdominal pain: Secondary | ICD-10-CM | POA: Diagnosis present

## 2022-02-05 HISTORY — PX: CHOLECYSTECTOMY: SHX55

## 2022-02-05 LAB — GLUCOSE, CAPILLARY
Glucose-Capillary: 104 mg/dL — ABNORMAL HIGH (ref 70–99)
Glucose-Capillary: 151 mg/dL — ABNORMAL HIGH (ref 70–99)
Glucose-Capillary: 161 mg/dL — ABNORMAL HIGH (ref 70–99)
Glucose-Capillary: 171 mg/dL — ABNORMAL HIGH (ref 70–99)

## 2022-02-05 LAB — TROPONIN I (HIGH SENSITIVITY): Troponin I (High Sensitivity): 4 ng/L (ref <18)

## 2022-02-05 IMAGING — US US ABDOMEN LIMITED
1 series · 15 of 25 positions shown · non-contrast
Comparison: CT from earlier the same day

CLINICAL DATA: Right upper quadrant pain

EXAM:
ULTRASOUND ABDOMEN LIMITED RIGHT UPPER QUADRANT

[Series 1: us abdomen limited ruq mc & wl · 15 of 63 slices shown]
[im 1/63]
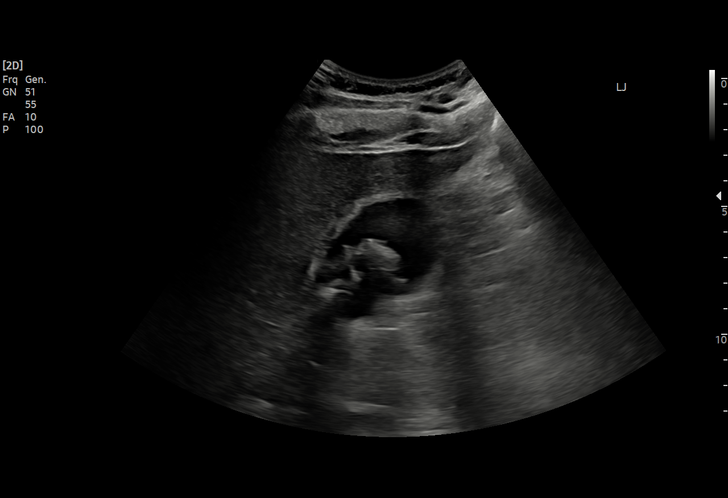
[im 6/63]
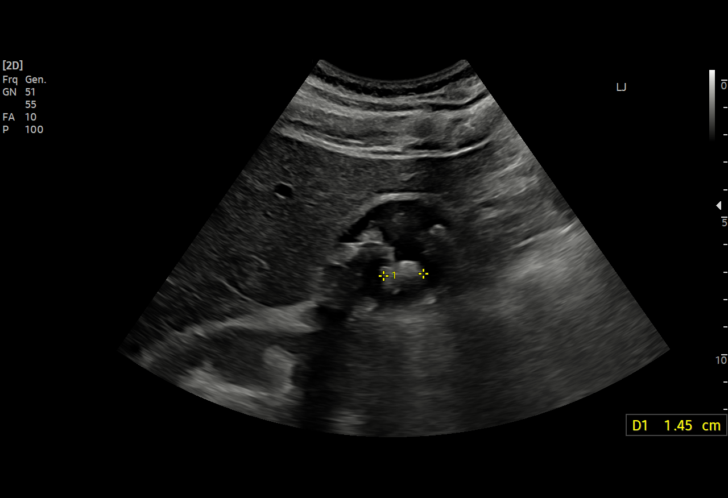
[im 11/63]
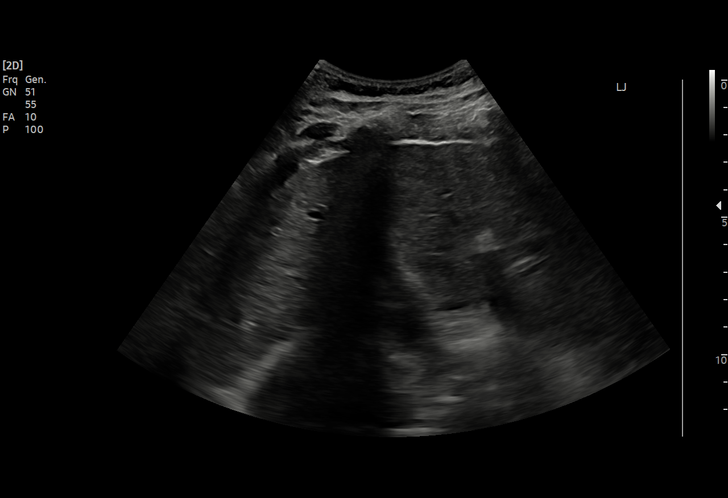
[im 13/63]
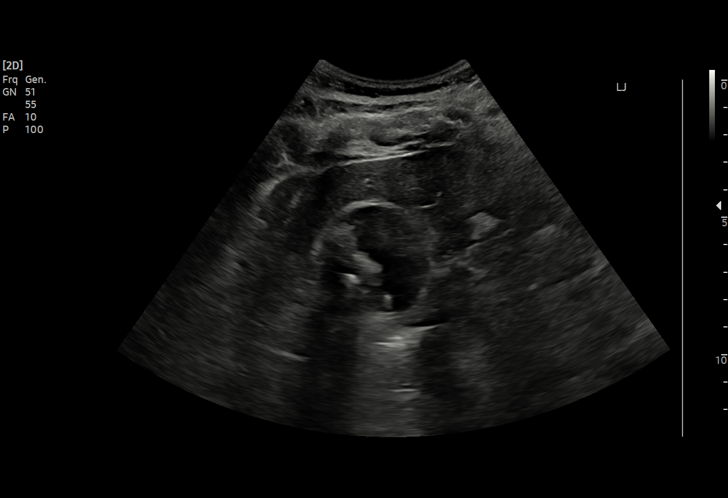
[im 19/63]
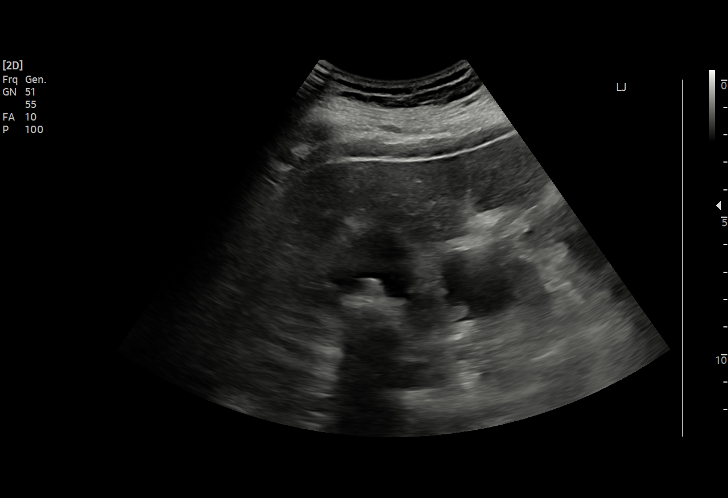
[im 24/63]
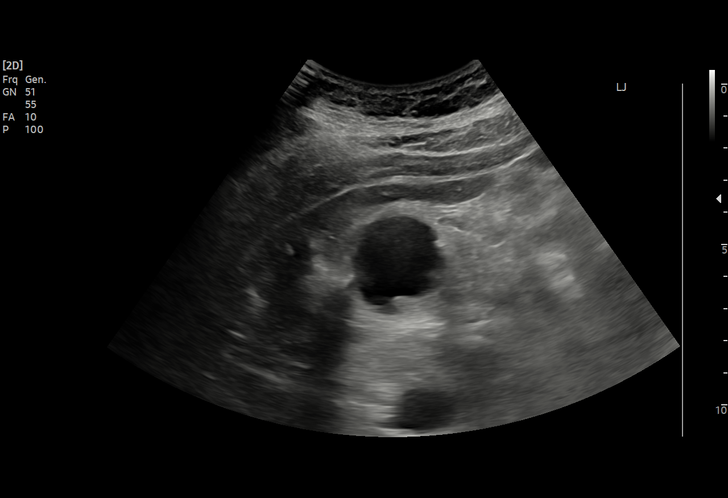
[im 26/63]
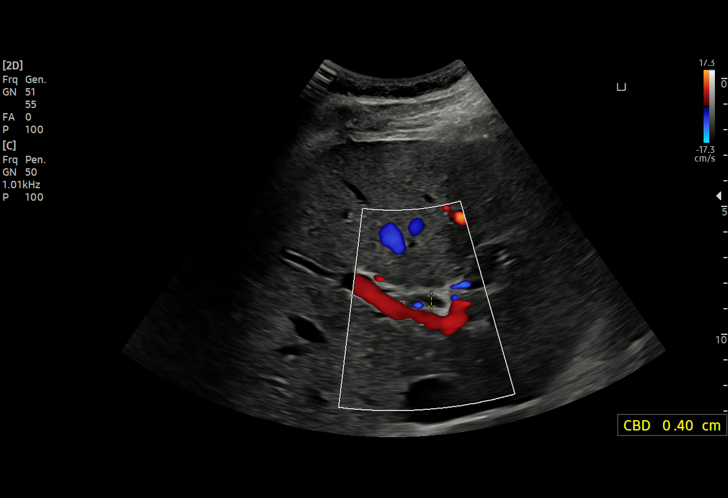
[im 32/63]
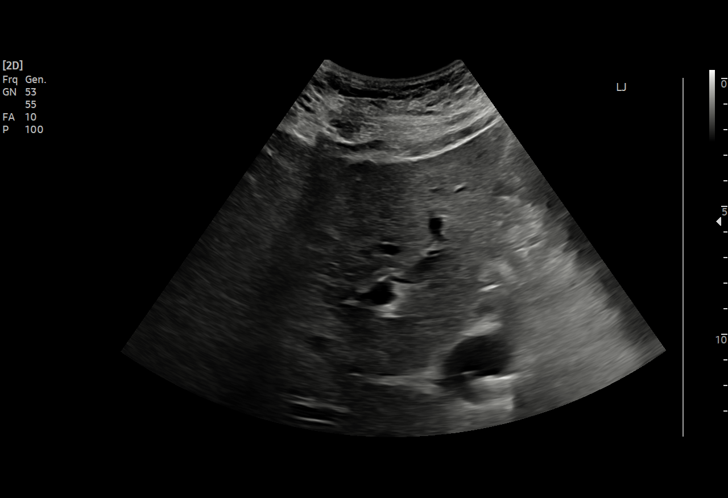
[im 37/63]
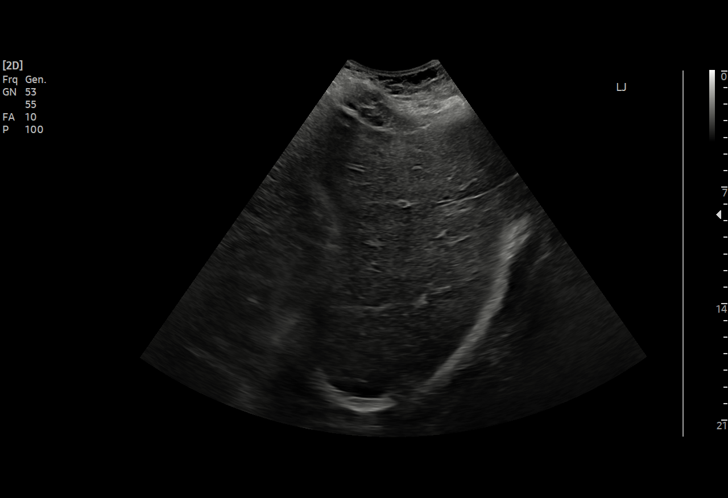
[im 39/63]
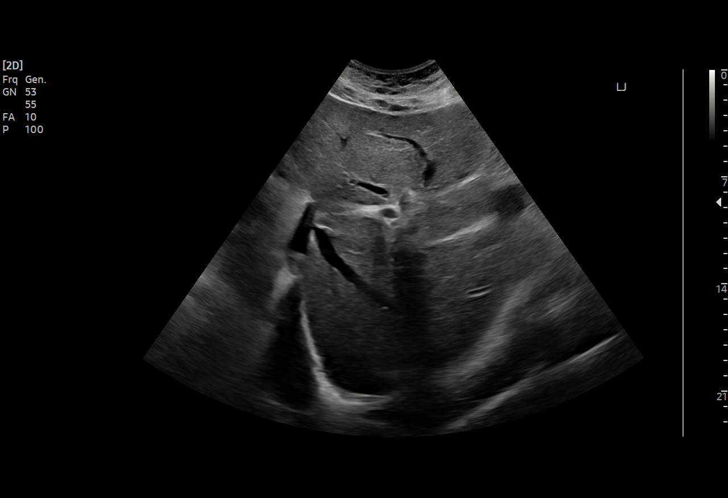
[im 44/63]
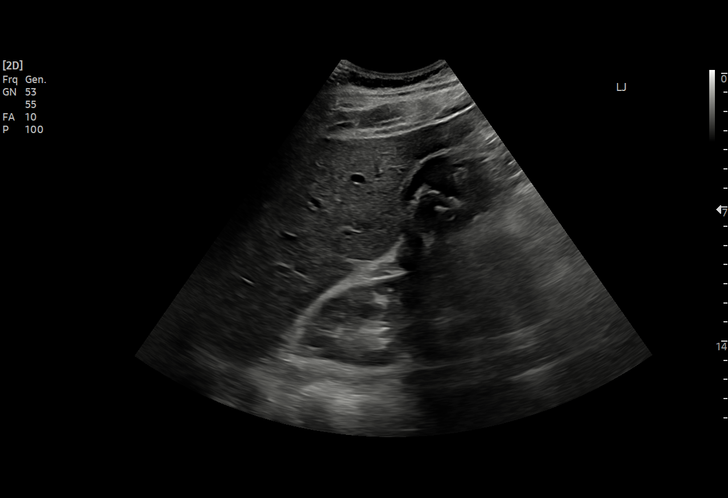
[im 50/63]
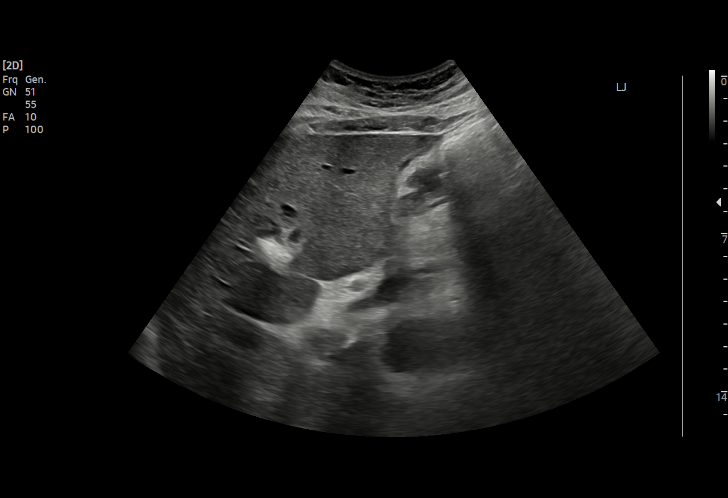
[im 52/63]
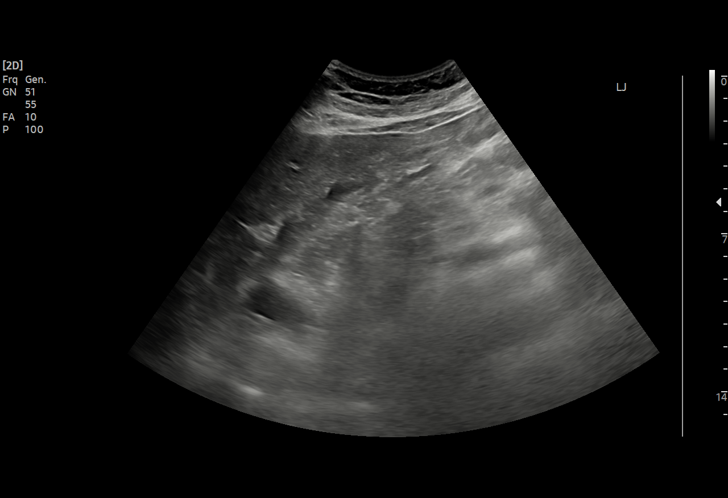
[im 57/63]
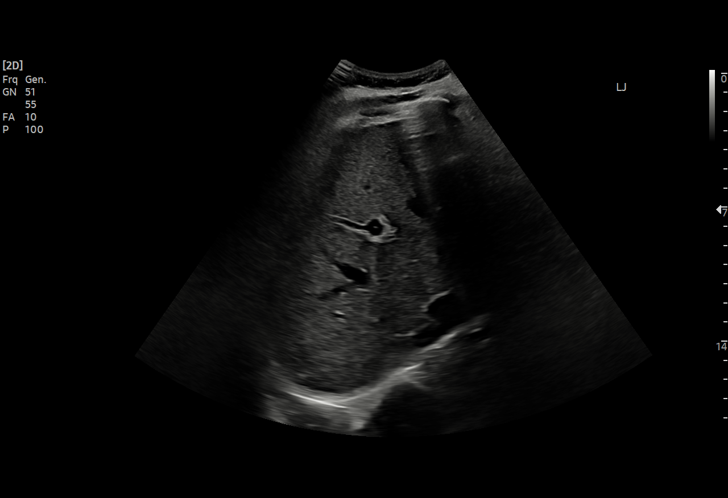
[im 63/63]
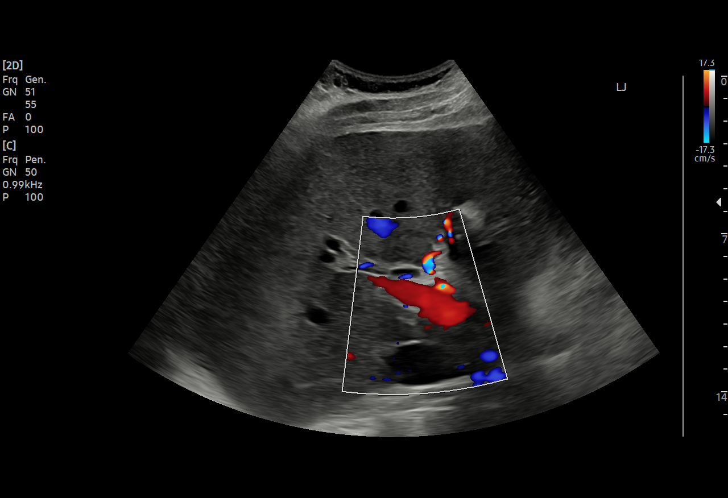

[15 of 25 positions shown; findings below may reference images not displayed]

FINDINGS: Gallbladder:

Multiple shadowing gallstones measured up to approximately 15 mm.
Mild gallbladder wall thickening a 14 mm but no focal tenderness or
pericholecystic edema.

Common bile duct:

Diameter: 4 mm.  Where visualized, no filling defect.

Liver:

No focal lesion identified. Within normal limits in parenchymal
echogenicity. Portal vein is patent on color Doppler imaging with
normal direction of blood flow towards the liver.
IMPRESSION: Multiple gallstones. Mild gallbladder wall thickening but no
tenderness or edema typical of acute cholecystitis.

## 2022-02-05 SURGERY — LAPAROSCOPIC CHOLECYSTECTOMY WITH INTRAOPERATIVE CHOLANGIOGRAM
Anesthesia: General

## 2022-02-05 MED ORDER — FAMOTIDINE IN NACL 20-0.9 MG/50ML-% IV SOLN
20.0000 mg | Freq: Once | INTRAVENOUS | Status: AC
  Administered 2022-02-05: 20 mg via INTRAVENOUS
  Filled 2022-02-05: qty 50

## 2022-02-05 MED ORDER — LIDOCAINE VISCOUS HCL 2 % MT SOLN
15.0000 mL | Freq: Once | OROMUCOSAL | Status: AC
  Administered 2022-02-05: 15 mL via ORAL
  Filled 2022-02-05: qty 15

## 2022-02-05 MED ORDER — POLYETHYLENE GLYCOL 3350 17 G PO PACK: 17.0000 g | PACK | Freq: Every day | ORAL | Status: DC | PRN

## 2022-02-05 MED ORDER — PROPOFOL 10 MG/ML IV BOLUS
INTRAVENOUS | Status: AC
  Filled 2022-02-05: qty 20

## 2022-02-05 MED ORDER — FENTANYL CITRATE (PF) 250 MCG/5ML IJ SOLN
INTRAMUSCULAR | Status: AC
  Filled 2022-02-05: qty 5

## 2022-02-05 MED ORDER — ONDANSETRON 4 MG PO TBDP: 4.0000 mg | ORAL_TABLET | Freq: Four times a day (QID) | ORAL | Status: DC | PRN

## 2022-02-05 MED ORDER — LIDOCAINE 2% (20 MG/ML) 5 ML SYRINGE
INTRAMUSCULAR | Status: DC | PRN
  Administered 2022-02-05: 60 mg via INTRAVENOUS

## 2022-02-05 MED ORDER — PHENYLEPHRINE HCL (PRESSORS) 10 MG/ML IV SOLN
INTRAVENOUS | Status: AC
  Filled 2022-02-05: qty 1

## 2022-02-05 MED ORDER — ACETAMINOPHEN 325 MG PO TABS: 650.0000 mg | ORAL_TABLET | Freq: Four times a day (QID) | ORAL | Status: DC | PRN

## 2022-02-05 MED ORDER — DEXAMETHASONE SODIUM PHOSPHATE 10 MG/ML IJ SOLN
INTRAMUSCULAR | Status: DC | PRN
  Administered 2022-02-05: 6 mg via INTRAVENOUS

## 2022-02-05 MED ORDER — BUPIVACAINE-EPINEPHRINE (PF) 0.25% -1:200000 IJ SOLN
INTRAMUSCULAR | Status: DC | PRN
  Administered 2022-02-05: 25 mL

## 2022-02-05 MED ORDER — METHOCARBAMOL 500 MG PO TABS
500.0000 mg | ORAL_TABLET | Freq: Three times a day (TID) | ORAL | Status: DC | PRN
  Administered 2022-02-05: 500 mg via ORAL
  Filled 2022-02-05: qty 1

## 2022-02-05 MED ORDER — ROCURONIUM BROMIDE 10 MG/ML (PF) SYRINGE
PREFILLED_SYRINGE | INTRAVENOUS | Status: DC | PRN
  Administered 2022-02-05: 60 mg via INTRAVENOUS

## 2022-02-05 MED ORDER — FENTANYL CITRATE PF 50 MCG/ML IJ SOSY
50.0000 ug | PREFILLED_SYRINGE | Freq: Once | INTRAMUSCULAR | Status: AC
  Administered 2022-02-05: 50 ug via INTRAVENOUS
  Filled 2022-02-05: qty 1

## 2022-02-05 MED ORDER — IOHEXOL 300 MG/ML  SOLN
INTRAMUSCULAR | Status: DC | PRN
  Administered 2022-02-05: 10 mL

## 2022-02-05 MED ORDER — FENTANYL CITRATE PF 50 MCG/ML IJ SOSY: 25.0000 ug | PREFILLED_SYRINGE | INTRAMUSCULAR | Status: DC | PRN

## 2022-02-05 MED ORDER — PROPOFOL 10 MG/ML IV BOLUS
INTRAVENOUS | Status: DC | PRN
  Administered 2022-02-05: 100 mg via INTRAVENOUS

## 2022-02-05 MED ORDER — SIMETHICONE 80 MG PO CHEW
40.0000 mg | CHEWABLE_TABLET | Freq: Four times a day (QID) | ORAL | Status: DC | PRN
  Filled 2022-02-05: qty 1

## 2022-02-05 MED ORDER — LIP MEDEX EX OINT
TOPICAL_OINTMENT | CUTANEOUS | Status: DC | PRN
  Filled 2022-02-05: qty 7

## 2022-02-05 MED ORDER — 0.9 % SODIUM CHLORIDE (POUR BTL) OPTIME
TOPICAL | Status: DC | PRN
  Administered 2022-02-05: 10 mL
  Administered 2022-02-05: 1000 mL

## 2022-02-05 MED ORDER — ONDANSETRON HCL 4 MG/2ML IJ SOLN
INTRAMUSCULAR | Status: DC | PRN
  Administered 2022-02-05: 4 mg via INTRAVENOUS

## 2022-02-05 MED ORDER — MORPHINE SULFATE (PF) 4 MG/ML IV SOLN
4.0000 mg | Freq: Once | INTRAVENOUS | Status: AC
  Administered 2022-02-05: 4 mg via INTRAVENOUS
  Filled 2022-02-05: qty 1

## 2022-02-05 MED ORDER — AMISULPRIDE (ANTIEMETIC) 5 MG/2ML IV SOLN: 10.0000 mg | Freq: Once | INTRAVENOUS | Status: DC | PRN

## 2022-02-05 MED ORDER — ACETAMINOPHEN 10 MG/ML IV SOLN: 1000.0000 mg | Freq: Once | INTRAVENOUS | Status: DC | PRN

## 2022-02-05 MED ORDER — SUGAMMADEX SODIUM 200 MG/2ML IV SOLN
INTRAVENOUS | Status: DC | PRN
  Administered 2022-02-05: 300 mg via INTRAVENOUS

## 2022-02-05 MED ORDER — SODIUM CHLORIDE 0.9 % IV BOLUS
500.0000 mL | Freq: Once | INTRAVENOUS | Status: AC
  Administered 2022-02-05: 500 mL via INTRAVENOUS

## 2022-02-05 MED ORDER — HYDROCHLOROTHIAZIDE 25 MG PO TABS
25.0000 mg | ORAL_TABLET | Freq: Every day | ORAL | Status: DC
  Administered 2022-02-06: 25 mg via ORAL
  Filled 2022-02-05: qty 1

## 2022-02-05 MED ORDER — SUCRALFATE 1 G PO TABS
1.0000 g | ORAL_TABLET | Freq: Once | ORAL | Status: AC
  Administered 2022-02-05: 1 g via ORAL
  Filled 2022-02-05: qty 1

## 2022-02-05 MED ORDER — ALBUTEROL SULFATE (2.5 MG/3ML) 0.083% IN NEBU: 2.5000 mg | INHALATION_SOLUTION | Freq: Four times a day (QID) | RESPIRATORY_TRACT | Status: DC | PRN

## 2022-02-05 MED ORDER — KETOROLAC TROMETHAMINE 15 MG/ML IJ SOLN: 15.0000 mg | Freq: Once | INTRAMUSCULAR | Status: DC | PRN

## 2022-02-05 MED ORDER — LABETALOL HCL 5 MG/ML IV SOLN
INTRAVENOUS | Status: DC | PRN
  Administered 2022-02-05: 5 mg via INTRAVENOUS
  Administered 2022-02-05: 2.5 mg via INTRAVENOUS

## 2022-02-05 MED ORDER — LABETALOL HCL 5 MG/ML IV SOLN
10.0000 mg | Freq: Once | INTRAVENOUS | Status: AC
  Administered 2022-02-05: 10 mg via INTRAVENOUS
  Filled 2022-02-05: qty 4

## 2022-02-05 MED ORDER — ONDANSETRON HCL 4 MG/2ML IJ SOLN
4.0000 mg | Freq: Once | INTRAMUSCULAR | Status: AC
  Administered 2022-02-05: 4 mg via INTRAVENOUS
  Filled 2022-02-05: qty 2

## 2022-02-05 MED ORDER — METHOCARBAMOL 1000 MG/10ML IJ SOLN
500.0000 mg | Freq: Three times a day (TID) | INTRAVENOUS | Status: DC | PRN
  Filled 2022-02-05: qty 5

## 2022-02-05 MED ORDER — BUPIVACAINE-EPINEPHRINE (PF) 0.25% -1:200000 IJ SOLN
INTRAMUSCULAR | Status: AC
  Filled 2022-02-05: qty 30

## 2022-02-05 MED ORDER — MORPHINE SULFATE (PF) 2 MG/ML IV SOLN: 2.0000 mg | INTRAVENOUS | Status: DC | PRN

## 2022-02-05 MED ORDER — MELATONIN 3 MG PO TABS: 3.0000 mg | ORAL_TABLET | Freq: Every evening | ORAL | Status: DC | PRN

## 2022-02-05 MED ORDER — DOCUSATE SODIUM 100 MG PO CAPS
100.0000 mg | ORAL_CAPSULE | Freq: Two times a day (BID) | ORAL | Status: DC
  Administered 2022-02-05 – 2022-02-06 (×2): 100 mg via ORAL
  Filled 2022-02-05 (×2): qty 1

## 2022-02-05 MED ORDER — ACETAMINOPHEN 650 MG RE SUPP: 650.0000 mg | Freq: Four times a day (QID) | RECTAL | Status: DC | PRN

## 2022-02-05 MED ORDER — METOPROLOL TARTRATE 5 MG/5ML IV SOLN: 5.0000 mg | Freq: Four times a day (QID) | INTRAVENOUS | Status: DC | PRN

## 2022-02-05 MED ORDER — ONDANSETRON HCL 4 MG/2ML IJ SOLN: 4.0000 mg | Freq: Once | INTRAMUSCULAR | Status: DC | PRN

## 2022-02-05 MED ORDER — MIDAZOLAM HCL 2 MG/2ML IJ SOLN
INTRAMUSCULAR | Status: AC
  Filled 2022-02-05: qty 2

## 2022-02-05 MED ORDER — ONDANSETRON HCL 4 MG/2ML IJ SOLN: 4.0000 mg | Freq: Four times a day (QID) | INTRAMUSCULAR | Status: DC | PRN

## 2022-02-05 MED ORDER — IOHEXOL 300 MG/ML  SOLN
100.0000 mL | Freq: Once | INTRAMUSCULAR | Status: AC | PRN
  Administered 2022-02-05: 100 mL via INTRAVENOUS

## 2022-02-05 MED ORDER — LACTATED RINGERS IV SOLN
INTRAVENOUS | Status: DC
Start: 2022-02-05 — End: 2022-02-06

## 2022-02-05 MED ORDER — SODIUM CHLORIDE 0.9 % IV SOLN
2.0000 g | Freq: Every day | INTRAVENOUS | Status: DC
  Administered 2022-02-05 – 2022-02-06 (×2): 2 g via INTRAVENOUS
  Filled 2022-02-05 (×2): qty 20

## 2022-02-05 MED ORDER — OXYCODONE HCL 5 MG PO TABS: 5.0000 mg | ORAL_TABLET | ORAL | Status: DC | PRN

## 2022-02-05 MED ORDER — DICYCLOMINE HCL 10 MG/5ML PO SOLN
10.0000 mg | Freq: Once | ORAL | Status: AC
  Administered 2022-02-05: 10 mg via ORAL
  Filled 2022-02-05: qty 5

## 2022-02-05 MED ORDER — HYDROMORPHONE HCL 1 MG/ML IJ SOLN
1.0000 mg | Freq: Once | INTRAMUSCULAR | Status: AC
  Administered 2022-02-05: 1 mg via INTRAVENOUS
  Filled 2022-02-05: qty 1

## 2022-02-05 MED ORDER — SUCCINYLCHOLINE CHLORIDE 200 MG/10ML IV SOSY
PREFILLED_SYRINGE | INTRAVENOUS | Status: DC | PRN
Start: 2022-02-05 — End: 2022-02-05
  Administered 2022-02-05: 90 mg via INTRAVENOUS

## 2022-02-05 MED ORDER — ACETAMINOPHEN 500 MG PO TABS
1000.0000 mg | ORAL_TABLET | Freq: Four times a day (QID) | ORAL | Status: DC | PRN
  Administered 2022-02-06: 1000 mg via ORAL
  Filled 2022-02-05: qty 2

## 2022-02-05 MED ORDER — ENOXAPARIN SODIUM 40 MG/0.4ML IJ SOSY
40.0000 mg | PREFILLED_SYRINGE | Freq: Every day | INTRAMUSCULAR | Status: DC
  Administered 2022-02-06: 40 mg via SUBCUTANEOUS
  Filled 2022-02-05: qty 0.4

## 2022-02-05 MED ORDER — PANTOPRAZOLE SODIUM 40 MG IV SOLR
40.0000 mg | Freq: Every day | INTRAVENOUS | Status: DC
  Administered 2022-02-05: 40 mg via INTRAVENOUS
  Filled 2022-02-05: qty 10

## 2022-02-05 MED ORDER — FENTANYL CITRATE (PF) 250 MCG/5ML IJ SOLN
INTRAMUSCULAR | Status: DC | PRN
  Administered 2022-02-05 (×3): 50 ug via INTRAVENOUS

## 2022-02-05 MED ORDER — ALUM & MAG HYDROXIDE-SIMETH 200-200-20 MG/5ML PO SUSP
30.0000 mL | Freq: Once | ORAL | Status: AC
Start: 2022-02-05 — End: 2022-02-05
  Administered 2022-02-05: 30 mL via ORAL
  Filled 2022-02-05: qty 30

## 2022-02-05 SURGICAL SUPPLY — 45 items
APL PRP STRL LF DISP 70% ISPRP (MISCELLANEOUS) ×2
APPLIER CLIP ROT 10 11.4 M/L (STAPLE) ×2
APR CLP MED LRG 11.4X10 (STAPLE) ×1
BAG COUNTER SPONGE SURGICOUNT (BAG) IMPLANT
BAG RETRIEVAL 10 (BASKET) ×1
BAG SPNG CNTER NS LX DISP (BAG)
CABLE HIGH FREQUENCY MONO STRZ (ELECTRODE) ×2 IMPLANT
CHLORAPREP W/TINT 26 (MISCELLANEOUS) ×4 IMPLANT
CLIP APPLIE ROT 10 11.4 M/L (STAPLE) ×1 IMPLANT
COVER MAYO STAND XLG (MISCELLANEOUS) ×2 IMPLANT
COVER SURGICAL LIGHT HANDLE (MISCELLANEOUS) ×2 IMPLANT
DERMABOND ADVANCED (GAUZE/BANDAGES/DRESSINGS) ×1
DERMABOND ADVANCED .7 DNX12 (GAUZE/BANDAGES/DRESSINGS) IMPLANT
DRAPE C-ARM 42X120 X-RAY (DRAPES) ×2 IMPLANT
ELECT REM PT RETURN 15FT ADLT (MISCELLANEOUS) ×2 IMPLANT
GAUZE SPONGE 2X2 8PLY STRL LF (GAUZE/BANDAGES/DRESSINGS) ×1 IMPLANT
GLOVE SURG ORTHO 8.0 STRL STRW (GLOVE) ×2 IMPLANT
GLOVE SURG SYN 7.5  E (GLOVE) ×4
GLOVE SURG SYN 7.5 E (GLOVE) ×2 IMPLANT
GLOVE SURG SYN 7.5 PF PI (GLOVE) ×2 IMPLANT
GOWN STRL REUS W/ TWL XL LVL3 (GOWN DISPOSABLE) ×2 IMPLANT
GOWN STRL REUS W/TWL XL LVL3 (GOWN DISPOSABLE) ×4
HEMOSTAT SURGICEL 4X8 (HEMOSTASIS) IMPLANT
IRRIG SUCT STRYKERFLOW 2 WTIP (MISCELLANEOUS) ×2
IRRIGATION SUCT STRKRFLW 2 WTP (MISCELLANEOUS) ×1 IMPLANT
KIT BASIN OR (CUSTOM PROCEDURE TRAY) ×2 IMPLANT
KIT TURNOVER KIT A (KITS) IMPLANT
PENCIL SMOKE EVACUATOR (MISCELLANEOUS) IMPLANT
SCISSORS LAP 5X35 DISP (ENDOMECHANICALS) ×2 IMPLANT
SET CHOLANGIOGRAPH MIX (MISCELLANEOUS) ×2 IMPLANT
SET TUBE SMOKE EVAC HIGH FLOW (TUBING) ×1 IMPLANT
SLEEVE XCEL OPT CAN 5 100 (ENDOMECHANICALS) ×2 IMPLANT
SPIKE FLUID TRANSFER (MISCELLANEOUS) ×2 IMPLANT
SPONGE GAUZE 2X2 STER 10/PKG (GAUZE/BANDAGES/DRESSINGS) ×1
STRIP CLOSURE SKIN 1/2X4 (GAUZE/BANDAGES/DRESSINGS) IMPLANT
SUT MNCRL AB 4-0 PS2 18 (SUTURE) ×3 IMPLANT
SUT VICRYL 0 UR6 27IN ABS (SUTURE) ×2 IMPLANT
SYS BAG RETRIEVAL 10MM (BASKET) ×1
SYSTEM BAG RETRIEVAL 10MM (BASKET) ×1 IMPLANT
TOWEL OR 17X26 10 PK STRL BLUE (TOWEL DISPOSABLE) ×2 IMPLANT
TOWEL OR NON WOVEN STRL DISP B (DISPOSABLE) ×2 IMPLANT
TRAY LAPAROSCOPIC (CUSTOM PROCEDURE TRAY) ×2 IMPLANT
TROCAR BLADELESS OPT 5 100 (ENDOMECHANICALS) ×2 IMPLANT
TROCAR XCEL BLUNT TIP 100MML (ENDOMECHANICALS) ×2 IMPLANT
TROCAR XCEL NON-BLD 11X100MML (ENDOMECHANICALS) ×2 IMPLANT

## 2022-02-05 NOTE — ED Notes (Addendum)
Pt placed 1L of O2 via [place] as pt is maintaining in the low 90's.  ?

## 2022-02-05 NOTE — ED Notes (Signed)
Pt given CHG wipes, changed out of home clothes and into hospital gown.  ?

## 2022-02-05 NOTE — ED Notes (Signed)
O2 turned off pt 95% on RA.  ?

## 2022-02-05 NOTE — Anesthesia Postprocedure Evaluation (Signed)
Anesthesia Post Note ? ?Patient: Kathleen Orr ? ?Procedure(s) Performed: LAPAROSCOPIC CHOLECYSTECTOMY WITH INTRAOPERATIVE CHOLANGIOGRAM ? ?  ? ?Patient location during evaluation: PACU ?Anesthesia Type: General ?Level of consciousness: awake ?Pain management: pain level controlled ?Vital Signs Assessment: post-procedure vital signs reviewed and stable ?Respiratory status: spontaneous breathing, nonlabored ventilation, respiratory function stable and patient connected to nasal cannula oxygen ?Cardiovascular status: blood pressure returned to baseline and stable ?Postop Assessment: no apparent nausea or vomiting ?Anesthetic complications: no ? ? ?No notable events documented. ? ?Last Vitals:  ?Vitals:  ? 02/05/22 1751 02/05/22 1850  ?BP: (!) 149/72 (!) 145/79  ?Pulse: 88 88  ?Resp: 18 12  ?Temp: 37.4 ?C 37.6 ?C  ?SpO2: 94% 93%  ?  ?Last Pain:  ?Vitals:  ? 02/05/22 1850  ?TempSrc: Oral  ?PainSc:   ? ? ?  ?  ?  ?  ?  ?  ? ?Vaun Hyndman P [name] ? ? ? ? ?

## 2022-02-05 NOTE — ED Provider Notes (Signed)
Patient was transferred from [facility] for evaluation of gallbladder.  Originally taken care of by Dr. Madilyn Hook. ? ?Prior note, patient has been having epigastric pain started Sunday at 10 AM.  Pain is constant. Radiates across RUQ.  She has associated nausea and one episode of emesis.  Labs are without leukocytosis, anemia.  CMP is overall unremarkable.  Lipase is normal.  Troponin negative.  Unfortunately, I cannot view the EKG as it does not look like it is uploaded.  She has had a CT that demonstrated cholelithiasis without complicating features.  She received fentanyl, morphine, Zofran, Pepcid, and labetalol for blood pressure with no relief of symptoms.  She was still unable to tolerate p.o. at time of discharge, it was recommended that she be transferred in order to recieve gallbladder ultrasound to rule out cholecystitis. ? ? ?Physical Exam  ?BP (!) 201/95   Pulse 68   Temp 98.6 ?F (37 ?C) (Oral)   Resp 18   Ht 5\' 5"  (1.651 m)   Wt 88.5 kg   SpO2 97%   BMI 32.45 kg/m?  ? ?Physical Exam ?Vitals and nursing note reviewed.  ?Constitutional:   ?   General: She is not in acute distress. ?   Appearance: Normal appearance. She is well-developed. She is not ill-appearing, toxic-appearing or diaphoretic.  ?HENT:  ?   Head: Normocephalic and atraumatic.  ?   Nose: No nasal deformity.  ?   Mouth/Throat:  ?   Lips: Pink. No lesions.  ?Eyes:  ?   General: Gaze aligned appropriately. No scleral icterus.    ?   Right eye: No discharge.     ?   Left eye: No discharge.  ?   Conjunctiva/sclera: Conjunctivae normal.  ?   Right eye: Right conjunctiva is not injected. No exudate or hemorrhage. ?   Left eye: Left conjunctiva is not injected. No exudate or hemorrhage. ?Pulmonary:  ?   Effort: Pulmonary effort is normal. No respiratory distress.  ?Abdominal:  ?   General: Abdomen is flat.  ?   Palpations: Abdomen is soft.  ?   Tenderness: There is abdominal tenderness in the right upper quadrant, epigastric area and  periumbilical area. There is guarding. There is no right CVA tenderness, left CVA tenderness or rebound. Positive signs include Murphy's sign. Negative signs include McBurney's sign.  ?   Comments: Moderate RUQ/epigastric tenderness with voluntary guarding. Mild tenderness present in generalized area as well.  ?Skin: ?   General: Skin is warm and dry.  ?Neurological:  ?   Mental Status: She is alert and oriented to person, place, and time.  ?Psychiatric:     ?   Mood and Affect: Mood normal.     ?   Speech: Speech normal.     ?   Behavior: Behavior normal. Behavior is cooperative.  ? ? ?Procedures  ?Procedures ? ?ED Course / MDM  ? ?Clinical Course as of 02/05/22 1216  ?Mon Feb 05, 2022  ?Feb 07, 2022 I was notified by RN that patient was hypoxic to 87% on 4 L.  I went to assess this patient and found that oxygen was not hooked up.  We applied 1 L of nasal cannula with improvement in saturations.  Patient does have a history of COPD and does not wear oxygen at home.  She was mentating normally during this episode.  No need for Narcan at this time. [GL]  ?9798 Reassessed patient and she continues to have significant abdominal discomfort. I  can consult general surgery to see if they will evaluate her  [GL]  ?  ?Clinical Course User Index ?[GL] Keiasia Christianson, Finis Bud, PA-C  ? ?Medical Decision Making ?Amount and/or Complexity of Data Reviewed ?Labs: ordered. ?Radiology: ordered. ? ?Risk ?OTC drugs. ?Prescription drug management. ?Decision regarding hospitalization. ? ? ?Gallbladder ultrasound reveals multiple gallstones with mild wall thickening, but no true evidence of acute cholecystitis. ? ?I reassessed patient after GI cocktail and third dose of IV narcotic.  Pain slightly improved, however patient does still seem to be very uncomfortable and having significant abdominal pain.  Plan to consult general surgery for possible symptomatic cholelithiasis. ? ?@0943  Dr. will see patient for consideration cholecystectomy.   ? ?This was a supervised visit with my attending physician, Dr. Georgana Curio. They have made changes appropriately  ? ? ? ?  ?Gwenette Greet, PA-C ?02/05/22 1217 ? ?  ?1218, DO ?02/05/22 1345 ? ?

## 2022-02-05 NOTE — H&P (Signed)
? ? ? ?Kathleen Orr ?November 20, 1955  ?[id number].   ? ?Requesting MD: Dr. Tanda Rockers ?Chief Complaint/Reason for Consult: Epigastric abdominal pain,  ? ?HPI: Kathleen Orr is a 66 y.o. female with a hx of DM2, HTN, COPD, HCV (treated) who presented to the ED for abdominal pain.  Patient reports yesterday, 4/23 at 10 AM, about an hour after eating fish, she started developing epigastric abdominal pain that radiated into her back and the lower portion of her SS chest. Tried tums for this without relief.  Notes associated subjective fever, nausea and emesis x4 (last episode just before transfer to [facility]). Underwent work-up in the ED at [facility] that showed normal WBC, LFTs and lipase.  CT A/P with cholelithiasis without other acute findings. She tried PO challenge but was unable to tolerate and had ongoing pain. She was transferred to [facility] for further workup/RUQ [place]. RUQ US showed multiple gallstones w/ 21mm gallbladder wall thickening; no pericholecystic fluid or muprhy's sign. Additional workup also noted for Tn wnl x 2 and trend flat. EKG w/ NSR, LVH and nonspecific t wave changes that is reported to be similar to prior per EDP note - I cannot see EKG from yesterday at [facility]. She was HTN on presentation w/ BP 195/103 requiring 10mg  of labetalol w/ improvement to 153/87 on last check. She is on 1L o2 which I tried to wean down to RA while in the room with an episode of hypoxia to 87-88%. She denies sob, cough or LE edema. She does not wear o2 at home. No reported hx of sleep apnea or chf. Appears she required o2 placement shortly after IV pain medication administration when reviewing MAR and RN note. We were asked to see to for symptomatic Cholelithiasis with possible Cholecystitis.  ?Patient does report history of known gallstones. Was seen in 2021 in ED for similar symptoms and dx w/ Cholelithiasis without Cholecystitis. Saw a surgeon outpatient, Dr. 2022 at [facility], who recommended her see GI first for EGD. Underwent  EGD 07/2020 which I am unable to see the results from. There is a telephone encounter from Dr. 08/2020 on 08/12/2020 that he recommended continuing PPI and return to clinic if she develops recurrent symptoms despite taking PPI to discuss possible cholecystectomy.  She reports she was doing well at home up until a month ago when she had a similar episode that self resolved No a similar episode today. She has been taking her PPI. ?Patient has a history of prior C-section, tubal ligation and hysterectomy.  She is not on blood thinners.  She quit alcohol 21 years ago.  She quit smoking 16 years ago.  No drug use.  Works as a 08/14/2020 for a [employer], [employer]. ? ?ROS: ?Review of Systems  ?Constitutional:  Positive for fever. Negative for chills.  ?Respiratory:  Negative for cough and shortness of breath.   ?Cardiovascular:  Positive for chest pain. Negative for leg swelling.  ?Gastrointestinal:  Positive for abdominal pain, constipation (last bm 2 days ago), nausea and vomiting. Negative for diarrhea.  ?Genitourinary: Negative.   ?Musculoskeletal:  Positive for back pain.  ?Psychiatric/Behavioral:  Negative for substance abuse.   ?All other systems reviewed and are negative. ? ?History reviewed. No pertinent family history. ? ?Past Medical History:  ?Diagnosis Date  ? Arthritis   ? Asthma   ? COPD (chronic obstructive pulmonary disease) (HCC)   ? Diabetes mellitus without complication (HCC)   ? Hepatitis C test positive   ? Hypertension   ? ? ?  Past Surgical History:  ?Procedure Laterality Date  ? ABDOMINAL HYSTERECTOMY    ? TUBAL LIGATION    ? ? ?Social History:  reports that she has quit smoking. Her smoking use included cigarettes. She has never used smokeless tobacco. She reports that she does not currently use drugs. She reports that she does not drink alcohol. ? ?Allergies: No Known Allergies ? ?(Not in a hospital admission) ? ? ? ?Physical Exam: ?Blood pressure (!) 153/87, pulse 72, temperature 98.6 ?F (37  ?C), temperature source Oral, resp. rate 20, height 5\' 5"  (1.651 m), weight 88.5 kg, SpO2 95 %. ?General: pleasant, WD/WN AA female who is laying in bed in NAD ?HEENT: head is normocephalic, atraumatic.  Sclera are noninjected.  PERRL.  Ears and nose without any masses or lesions.  Mouth is pink and moist. Dentition fair ?Heart: regular, rate, and rhythm.  No obvious murmurs noted.  Palpable pedal pulses bilaterally and equal. ?Lungs: CTAB without obvious wheezes, rhonchi, or rales noted.  Respiratory effort nonlabored. On 1L o2.  ?Abd:  Soft, ND, epigastric and RUQ tenderness without peritonitis. +BS. No masses, hernias, or organomegaly ?MS: no BUE/BLE edema, calves soft and nontender ?Skin: warm and dry with no masses, lesions, or rashes ?Psych: A&Ox4 with an appropriate affect ?Neuro: cranial nerves grossly intact, normal speech, thought process intact, moves all extremities, gait not assessed ? ? ?Results for orders placed or performed during the hospital encounter of 02/05/22 (from the past 48 hour(s))  ?Lipase, blood     Status: None  ? Collection Time: 02/04/22 10:15 PM  ?Result Value Ref Range  ? Lipase 28 11 - 51 U/L  ?  Comment: Performed at [facility] [place], [address] [place], [place] [address]  ?Comprehensive metabolic panel     Status: Abnormal  ? Collection Time: 02/04/22 10:15 PM  ?Result Value Ref Range  ? Sodium 136 135 - 145 mmol/L  ? Potassium 3.5 3.5 - 5.1 mmol/L  ? Chloride 104 98 - 111 mmol/L  ? CO2 24 22 - 32 mmol/L  ? Glucose, Bld 145 (H) 70 - 99 mg/dL  ?  Comment: Glucose reference range applies only to samples taken after fasting for at least 8 hours.  ? BUN 11 8 - 23 mg/dL  ? Creatinine, Ser 0.76 0.44 - 1.00 mg/dL  ? Calcium 9.5 8.9 - 10.3 mg/dL  ? Total Protein 7.8 6.5 - 8.1 g/dL  ? Albumin 4.3 3.5 - 5.0 g/dL  ? AST 23 15 - 41 U/L  ? ALT 16 0 - 44 U/L  ? Alkaline Phosphatase 90 38 - 126 U/L  ? Total Bilirubin 0.8 0.3 - 1.2 mg/dL  ? GFR, Estimated >60 >60 mL/min  ?  Comment:  (NOTE) ?Calculated using the CKD-EPI Creatinine Equation (2021) ?  ? Anion gap 8 5 - 15  ?  Comment: Performed at [facility] [place], [address] [place], [place] [address]  ?CBC     Status: Abnormal  ? Collection Time: 02/04/22 10:15 PM  ?Result Value Ref Range  ? WBC 7.7 4.0 - 10.5 K/uL  ? RBC 5.14 (H) 3.87 - 5.11 MIL/uL  ? Hemoglobin 14.5 12.0 - 15.0 g/dL  ? HCT 43.7 36.0 - 46.0 %  ? MCV 85.0 80.0 - 100.0 fL  ? MCH 28.2 26.0 - 34.0 pg  ? MCHC 33.2 30.0 - 36.0 g/dL  ? RDW 12.5 11.5 - 15.5 %  ? Platelets 218 150 - 400 K/uL  ?  nRBC 0.0 0.0 - 0.2 %  ?  Comment: Performed at [facility], [address] [place], [place] [address]  ?Troponin I (High Sensitivity)     Status: None  ? Collection Time: 02/04/22 10:15 PM  ?Result Value Ref Range  ? Troponin I (High Sensitivity) 4 <18 ng/L  ?  Comment: (NOTE) ?Elevated high sensitivity troponin I (hsTnI) values and significant  ?changes across serial measurements may suggest ACS but many other  ?chronic and acute conditions are known to elevate hsTnI results.  ?Refer to the "Links" section for chest pain algorithms and additional  ?guidance. ?Performed at [facility], [address] [place], [place] [address] ?  ?Troponin I (High Sensitivity)     Status: None  ? Collection Time: 02/05/22  1:33 AM  ?Result Value Ref Range  ? Troponin I (High Sensitivity) 4 <18 ng/L  ?  Comment: (NOTE) ?Elevated high sensitivity troponin I (hsTnI) values and significant  ?changes across serial measurements may suggest ACS but many other  ?chronic and acute conditions are known to elevate hsTnI results.  ?Refer to the "Links" section for chest pain algorithms and additional  ?guidance. ?Performed at [facility], [address] [place], [place] [address] ?  ? ?CT Abdomen Pelvis W Contrast ? ?Result Date: 02/05/2022 ?CLINICAL DATA:  Abdominal pain, nausea/vomiting EXAM: CT ABDOMEN AND PELVIS WITH CONTRAST TECHNIQUE: Multidetector CT imaging of the  abdomen and pelvis was performed using the standard protocol following bolus administration of intravenous contrast. RADIATION DOSE REDUCTION: This exam was performed according to the departmental dose-opt

## 2022-02-05 NOTE — Anesthesia Preprocedure Evaluation (Addendum)
Anesthesia Evaluation  ?Patient identified by MRN, date of birth, ID band ?Patient awake ? ? ? ?Reviewed: ?Allergy & Precautions, NPO status , Patient's Chart, lab work & pertinent test results ? ?Airway ?Mallampati: III ? ?TM Distance: >3 FB ?Neck ROM: Full ? ? ? Dental ? ?(+) Missing ?  ?Pulmonary ?asthma , COPD,  COPD inhaler, former smoker,  ?  ?Pulmonary exam normal ? ? ? ? ? ? ? Cardiovascular ?hypertension, Pt. on medications ?Normal cardiovascular exam ? ? ?  ?Neuro/Psych ?negative neurological ROS ? negative psych ROS  ? GI/Hepatic ?negative GI ROS, (+) Hepatitis - (treated)  ?Endo/Other  ?diabetes ? Renal/GU ?negative Renal ROS  ? ?  ?Musculoskeletal ? ?(+) Arthritis ,  ? Abdominal ?(+) + obese,   ?Peds ? Hematology ?negative hematology ROS ?(+)   ?Anesthesia Other Findings ?ACUTE CHOLECYSTITIS ? Reproductive/Obstetrics ? ?  ? ? ? ? ? ? ? ? ? ? ? ? ? ?  ?  ? ? ? ? ? ? ? ?Anesthesia Physical ?Anesthesia Plan ? ?ASA: 3 ? ?Anesthesia Plan: General  ? ?Post-op Pain Management:   ? ?Induction: Intravenous and Rapid sequence ? ?PONV Risk Score and Plan: 3 and Ondansetron, Dexamethasone and Treatment may vary due to age or medical condition ? ?Airway Management Planned: Oral ETT ? ?Additional Equipment:  ? ?Intra-op Plan:  ? ?Post-operative Plan: Extubation in OR ? ?Informed Consent: I have reviewed the patients History and Physical, chart, labs and discussed the procedure including the risks, benefits and alternatives for the proposed anesthesia with the patient or authorized representative who has indicated his/her understanding and acceptance.  ? ? ? ?Dental advisory given ? ?Plan Discussed with: CRNA ? ?Anesthesia Plan Comments:   ? ? ? ? ? ?Anesthesia Quick Evaluation ? ?

## 2022-02-05 NOTE — Anesthesia Procedure Notes (Signed)
Procedure Name: Intubation ?Date/Time: 02/05/2022 12:52 PM ?Performed by: Cynda Familia, CRNA ?Pre-anesthesia Checklist: Patient identified, Emergency Drugs available, Suction available and Patient being monitored ?Patient Re-evaluated:Patient Re-evaluated prior to induction ?Oxygen Delivery Method: Circle System Utilized ?Preoxygenation: Pre-oxygenation with 100% oxygen ?Induction Type: IV induction, Rapid sequence and Cricoid Pressure applied ?Ventilation: Mask ventilation without difficulty ?Laryngoscope Size: Sabra Heck and 2 ?Grade View: Grade I ?Tube type: Oral ?Number of attempts: 1 ?Airway Equipment and Method: Stylet ?Placement Confirmation: ETT inserted through vocal cords under direct vision, positive ETCO2 and breath sounds checked- equal and bilateral ?Secured at: 22 cm ?Tube secured with: Tape ?Dental Injury: Teeth and Oropharynx as per pre-operative assessment  ?Comments: IV induction Ellender-- intubation AM CRNA- atraumatic-- teeth and mouth as preop- bilat BS.-- no upper teeth only 5 teeth below - chipped-- all teeth as preop ? ? ? ? ?

## 2022-02-05 NOTE — Discharge Instructions (Signed)
[facility]  Please arrive at least 30 min before your appointment to complete your check in paperwork.  If you are unable to arrive 30 min prior to your appointment time we may have to cancel or reschedule you. LAPAROSCOPIC SURGERY: POST OP INSTRUCTIONS Always review your discharge instruction sheet given to you by the facility where your surgery was performed. IF YOU HAVE DISABILITY OR FAMILY LEAVE FORMS, YOU MUST BRING THEM TO THE OFFICE FOR PROCESSING.   DO NOT GIVE THEM TO YOUR DOCTOR.  PAIN CONTROL  First take acetaminophen (Tylenol) AND/or ibuprofen (Advil) to control your pain after surgery.  Follow directions on package.  Taking acetaminophen (Tylenol) and/or ibuprofen (Advil) regularly after surgery will help to control your pain and lower the amount of prescription pain medication you may need.  You should not take more than 4,000 mg (4 grams) of acetaminophen (Tylenol) in 24 hours.  You should not take ibuprofen (Advil), aleve, motrin, naprosyn or other NSAIDS if you have a history of stomach ulcers or chronic kidney disease.  A prescription for pain medication may be given to you upon discharge.  Take your pain medication as prescribed, if you still have uncontrolled pain after taking acetaminophen (Tylenol) or ibuprofen (Advil). Use ice packs to help control pain. If you need a refill on your pain medication, please contact your pharmacy.  They will contact our office to request authorization. Prescriptions will not be filled after 5pm or on week-ends.  HOME MEDICATIONS Take your usually prescribed medications unless otherwise directed.  DIET You should follow a light diet the first few days after arrival home.  Be sure to include lots of fluids daily. Avoid fatty, fried foods.   CONSTIPATION It is common to experience some constipation after surgery and if you are taking pain medication.  Increasing fluid intake and taking a stool softener (such as Colace)  will usually help or prevent this problem from occurring.  A mild laxative (Milk of Magnesia or Miralax) should be taken according to package instructions if there are no bowel movements after 48 hours.  WOUND/INCISION CARE Most patients will experience some swelling and bruising in the area of the incisions.  Ice packs will help.  Swelling and bruising can take several days to resolve.  Unless discharge instructions indicate otherwise, follow guidelines below  STERI-STRIPS - you may remove your outer bandages 48 hours after surgery, and you may shower at that time.  You have steri-strips (small skin tapes) in place directly over the incision.  These strips should be left on the skin for 7-10 days.   DERMABOND/SKIN GLUE - you may shower in 24 hours.  The glue will flake off over the next 2-3 weeks. Any sutures or staples will be removed at the office during your follow-up visit.  ACTIVITIES You may resume regular (light) daily activities beginning the next day--such as daily self-care, walking, climbing stairs--gradually increasing activities as tolerated.  You may have sexual intercourse when it is comfortable.  Refrain from any heavy lifting or straining until approved by your doctor. You may drive when you are no longer taking prescription pain medication, you can comfortably wear a seatbelt, and you can safely maneuver your car and apply brakes.  FOLLOW-UP You should see your doctor in the office for a follow-up appointment approximately 2-3 weeks after your surgery.  You should have been given your post-op/follow-up appointment when your surgery was scheduled.  If you did not receive a post-op/follow-up appointment, make sure   that you call for this appointment within a day or two after you arrive home to insure a convenient appointment time.   WHEN TO CALL YOUR DOCTOR: Fever over 101.0 Inability to urinate Continued bleeding from incision. Increased pain, redness, or drainage from the  incision. Increasing abdominal pain  The clinic staff is available to answer your questions during regular business hours.  Please don't hesitate to call and ask to speak to one of the nurses for clinical concerns.  If you have a medical emergency, go to the nearest emergency room or call 911.  A surgeon from [facility] is always on call at the hospital. [address], [address], [place], [place]  [address] ? [address] [address], [place], [place]   [address] [phone] ? [phone] ? FAX [phone]  

## 2022-02-05 NOTE — Op Note (Addendum)
Procedure Note ? ?Pre-operative Diagnosis:  cholecystitis, cholelithiasis ? ?Post-operative Diagnosis:  same ? ?Surgeon:  Darnell Level, MD ? ?Assistant:  Benna Dunks, MD ([facility] Resident)  ? ?Procedure:  Laparoscopic cholecystectomy with intra-operative cholangiography ? ?Anesthesia:  General ? ?Estimated Blood Loss:  minimal ? ?Drains: none ?        ?Specimen: gallbladder to pathology ? ?Indications:  Patient is a 66 yo female with acute cholecystitis and cholelithiasis for cholecystectomy. ? ?I was personally present during the key and critical portions of this procedure and immediately available throughout the entire procedure, as documented in my operative note.  ? ?Procedure description: The patient was seen in the pre-op holding area. The risks, benefits, complications, treatment options, and expected outcomes were previously discussed with the patient. The patient agreed with the proposed plan and has signed the informed consent form. ? ?The patient was transported to operating room #4 at the [facility]. The patient was placed in the supine position on the operating room table. Following induction of general anesthesia, the abdomen was prepped and draped in the usual aseptic fashion. ? ?An incision was made in the skin near the umbilicus. The midline fascia was incised and the peritoneal cavity was entered and a Hasson cannula was introduced under direct vision. The cannula was secured with a 0-Vicryl pursestring suture. Pneumoperitoneum was established with carbon dioxide. Additional cannulae were introduced under direct vision along the right costal margin in the midline, mid-clavicular line, and anterior axillary line. ?  ?The gallbladder was identified.  It was acutely inflamed and thick-walled.  It was aspirated with a trocar and the fundus grasped and retracted cephalad. Adhesions were taken down bluntly and the electrocautery was utilized as needed, taking care not to involve any adjacent  structures. The infundibulum was grasped and retracted laterally, exposing the peritoneum overlying the triangle of Calot. The peritoneum was incised and structures exposed with blunt dissection. The cystic duct was clearly identified, bluntly dissected circumferentially, and clipped at the neck of the gallbladder. ? ?An incision was made in the cystic duct and the cholangiogram catheter introduced. The catheter was secured using an ligaclip.  Real-time cholangiography was performed using C-arm fluoroscopy.  There was rapid filling of a normal caliber common bile duct.  There was reflux of contrast into the left and right hepatic ductal systems.  There was free flow distally into the duodenum without filling defect or obstruction.  The catheter was removed from the peritoneal cavity. ? ?The cystic duct was then ligated with ligaclips and divided. The cystic artery was identified, dissected circumferentially, ligated with ligaclips, and divided. ? ?The gallbladder was dissected away from the gallbladder bed using the electrocautery for hemostasis. The gallbladder was completely removed from the liver and placed into an endocatch bag. The gallbladder was removed in the endocatch bag through the umbilical port site and submitted to pathology for review. ? ?The right upper quadrant was irrigated and the gallbladder bed was inspected. Hemostasis was achieved with the electrocautery. ? ?Cannulae were removed under direct vision and good hemostasis was noted. Pneumoperitoneum was released and the majority of the carbon dioxide evacuated. The umbilical wound was irrigated and the fascia was then closed with the pursestring suture.  Local anesthetic was infiltrated at all port sites. Skin incisions were closed with 4-0 Monocril subcuticular sutures and Dermabond was applied. ? ?Instrument, sponge, and needle counts were correct at the conclusion of the case.  The patient was awakened from anesthesia and brought to the  recovery room in stable condition.  The patient tolerated the procedure well. ? ? ?Armandina Gemma, MD ?[facility] ?Office: [phone] ?  ?

## 2022-02-05 NOTE — ED Provider Notes (Signed)
?[facility] EMERGENCY DEPARTMENT ?Provider Note ? ? ?CSN: [id number] ?Arrival date & time: 02/04/22  2146 ? ?  ? ?History ? ?Chief Complaint  ?Patient presents with  ? Abdominal Pain  ? ? ?Kathleen Orr is a 66 y.o. female. ? ?The history is provided by the patient and medical records.  ?Abdominal Pain ?Kathleen Orr is a 66 y.o. female who presents to the Emergency Department complaining of abdominal pain.  She presents to the ED for evaluation of epigastric abdominal pain that started Sunday morning that started at 10am.  Pain is constant, radiates to her chest.  Has nausea, wants to throw up but has only been able to once.   ? ?No fever, diarrhea.  Has constipation - last BM yesterday.  No dysuria.  No sob.   ? ?Has a hx/o HTN, T2DM.  Has a hx/o hysterectomy.  ?  ? ?Home Medications ?Prior to Admission medications   ?Medication Sig Start Date End Date Taking? Authorizing Provider  ?Fluticasone Propionate (FLONASE ALLERGY RELIEF NA) Place 1 spray into the nose daily as needed.    [provider]  ?hydrochlorothiazide (HYDRODIURIL) 25 MG tablet Take 25 mg by mouth daily.    [provider]  ?ibuprofen (ADVIL) 800 MG tablet Take 800 mg by mouth daily as needed.    [provider]  ?methocarbamol (ROBAXIN) 500 MG tablet Take 1 tablet (500 mg total) by mouth every 8 (eight) hours as needed for muscle spasms. 05/30/21   Davonna Belling, MD  ?omeprazole (PRILOSEC) 20 MG capsule Take 1 capsule (20 mg total) by mouth 2 (two) times daily before a meal. 05/21/20   Domenic Moras, PA-C  ?sucralfate (CARAFATE) 1 g tablet Take 1 tablet (1 g total) by mouth 4 (four) times daily -  with meals and at bedtime. ?Patient not taking: Reported on 05/31/2020 05/21/20   Domenic Moras, PA-C  ?   ? ?Allergies    ?Patient has no known allergies.   ? ?Review of Systems   ?Review of Systems  ?Gastrointestinal:  Positive for abdominal pain.  ?All other systems reviewed and are negative. ? ?Physical Exam ?Updated Vital  Signs ?BP (!) 201/96   Pulse 69   Temp 98.6 ?F (37 ?C) (Oral)   Resp (!) 21   Ht 5\' 5"  (1.651 m)   Wt 88.5 kg   SpO2 94%   BMI 32.45 kg/m?  ?Physical Exam ?Vitals and nursing note reviewed.  ?Constitutional:   ?   Appearance: She is well-developed.  ?HENT:  ?   Head: Normocephalic and atraumatic.  ?Cardiovascular:  ?   Rate and Rhythm: Regular rhythm. Tachycardia present.  ?   Heart sounds: No murmur heard. ?Pulmonary:  ?   Effort: Pulmonary effort is normal. No respiratory distress.  ?   Breath sounds: Normal breath sounds.  ?Abdominal:  ?   Tenderness: There is no guarding or rebound.  ?   Comments: Mild distension.  Moderate epigastric tenderness, mild lower abdominal tenderness  ?Musculoskeletal:     ?   General: No swelling or tenderness.  ?   Comments: 2+ DP pulses bilaterally  ?Skin: ?   General: Skin is warm and dry.  ?Neurological:  ?   Mental Status: She is alert and oriented to person, place, and time.  ?Psychiatric:     ?   Behavior: Behavior normal.  ? ? ?ED Results / Procedures / Treatments   ?Labs ?(all labs ordered are listed, but only abnormal results are displayed) ?Labs  Reviewed  ?COMPREHENSIVE METABOLIC PANEL - Abnormal; Notable for the following components:  ?    Result Value  ? Glucose, Bld 145 (*)   ? All other components within normal limits  ?CBC - Abnormal; Notable for the following components:  ? RBC 5.14 (*)   ? All other components within normal limits  ?LIPASE, BLOOD  ?URINALYSIS, ROUTINE W REFLEX MICROSCOPIC  ?TROPONIN I (HIGH SENSITIVITY)  ?TROPONIN I (HIGH SENSITIVITY)  ? ? ?EKG ?None ? ?Radiology ?CT Abdomen Pelvis W Contrast ? ?Result Date: 02/05/2022 ?CLINICAL DATA:  Abdominal pain, nausea/vomiting EXAM: CT ABDOMEN AND PELVIS WITH CONTRAST TECHNIQUE: Multidetector CT imaging of the abdomen and pelvis was performed using the standard protocol following bolus administration of intravenous contrast. RADIATION DOSE REDUCTION: This exam was performed according to the departmental  dose-optimization program which includes automated exposure control, adjustment of the mA and/or kV according to patient size and/or use of iterative reconstruction technique. CONTRAST:  161mL OMNIPAQUE IOHEXOL 300 MG/ML  SOLN COMPARISON:  09/15/2021 FINDINGS: Lower chest: Lung bases are clear. Hepatobiliary: Liver is within normal limits. Cholelithiasis (series 2/image 28), without associated inflammatory changes. No intrahepatic or extrahepatic ductal dilatation. Pancreas: Within normal limits. Spleen: Within normal limits. Adrenals/Urinary Tract: Adrenal glands within normal limits. Kidneys are within normal limits.  No hydronephrosis. Bladder is within normal limits. Stomach/Bowel: Stomach is notable for a tiny hiatal hernia. No evidence of bowel obstruction. Normal appendix (coronal image 51). Mild sigmoid diverticulosis, without evidence of diverticulitis. Vascular/Lymphatic: No evidence of abdominal aortic aneurysm. Atherosclerotic calcifications of the abdominal aorta and branch vessels. No suspicious abdominopelvic lymphadenopathy. Reproductive: Uterus is within normal limits.  No adnexal masses. Other: No abdominopelvic ascites. Musculoskeletal: Mild degenerative changes at L5-S1. IMPRESSION: Cholelithiasis, without associated inflammatory changes. Otherwise negative CT abdomen/pelvis. Electronically Signed   By: Julian Hy M.D.   On: 02/05/2022 03:30   ? ?Procedures ?Procedures  ? ? ?Medications Ordered in ED ?Medications  ?morphine (PF) 4 MG/ML injection 4 mg (4 mg Intravenous Given 02/05/22 0248)  ?ondansetron [facility]) injection 4 mg (4 mg Intravenous Given 02/05/22 0246)  ?sodium chloride 0.9 % bolus 500 mL (0 mLs Intravenous Stopped 02/05/22 0440)  ?famotidine (PEPCID) IVPB 20 mg premix (0 mg Intravenous Stopped 02/05/22 0440)  ?iohexol (OMNIPAQUE) 300 MG/ML solution 100 mL (100 mLs Intravenous Contrast Given 02/05/22 0308)  ?fentaNYL (SUBLIMAZE) injection 50 mcg (50 mcg Intravenous Given 02/05/22  0519)  ? ? ?ED Course/ Medical Decision Making/ A&P ?  ?                        ?Medical Decision Making ?Amount and/or Complexity of Data Reviewed ?Labs: ordered. ?Radiology: ordered. ? ?Risk ?Prescription drug management. ? ? ?Patient here for evaluation of upper abdominal pain, nausea, vomiting.  She has tenderness on examination with mild upper abdominal distention.  Labs are without acute abnormality.  CT abdomen pelvis was obtained that demonstrates cholelithiasis without complicating features.  Patient with persistent pain on reassessment.  Initially she was requesting discharge but unable to tolerate orals and still has ongoing pain.  Recommend that she get right upper quadrant ultrasound to rule out cholecystitis.  She was treated with additional pain medications.  Patient is agreeable to transfer for additional imaging.  Discussed with Dr. Christy Gentles, who accept the patient in transfer for further imaging of her gallbladder. ? ? ? ? ? ? ? ?Final Clinical Impression(s) / ED Diagnoses ?Final diagnoses:  ?None  ? ? ?Rx / DC Orders ?  ED Discharge Orders   ? ? None  ? ?  ? ? ?  ?Quintella Reichert, MD ?02/05/22 [phone] ? ?

## 2022-02-05 NOTE — Anesthesia Procedure Notes (Signed)
Date/Time: 02/05/2022 2:19 PM ?Performed by: Minerva Ends, CRNA ?Oxygen Delivery Method: Simple face mask ?Placement Confirmation: positive ETCO2 and breath sounds checked- equal and bilateral ?Dental Injury: Teeth and Oropharynx as per pre-operative assessment  ? ? ? ? ?

## 2022-02-05 NOTE — Transfer of Care (Signed)
Immediate Anesthesia Transfer of Care Note ? ?Patient: Kathleen Orr ? ?Procedure(s) Performed: LAPAROSCOPIC CHOLECYSTECTOMY WITH INTRAOPERATIVE CHOLANGIOGRAM ? ?Patient Location: PACU ? ?Anesthesia Type:General ? ?Level of Consciousness: sedated ? ?Airway & Oxygen Therapy: Patient Spontanous Breathing and Patient connected to face mask oxygen ? ?Post-op Assessment: Report given to RN and Post -op Vital signs reviewed and stable ? ?Post vital signs: Reviewed and stable ? ?Last Vitals:  ?Vitals Value Taken Time  ?BP 195/88 02/05/22 1435  ?Temp 37.9 ?C 02/05/22 1430  ?Pulse 88 02/05/22 1437  ?Resp 22 02/05/22 1437  ?SpO2 94 % 02/05/22 1437  ?Vitals shown include unvalidated device data. ? ?Last Pain:  ?Vitals:  ? 02/05/22 1430  ?TempSrc:   ?PainSc: Asleep  ?   ? ?  ? ?Complications: No notable events documented. ?

## 2022-02-06 ENCOUNTER — Encounter (HOSPITAL_COMMUNITY): Payer: Self-pay | Admitting: Surgery

## 2022-02-06 DIAGNOSIS — K8000 Calculus of gallbladder with acute cholecystitis without obstruction: Secondary | ICD-10-CM | POA: Diagnosis not present

## 2022-02-06 LAB — HIV ANTIBODY (ROUTINE TESTING W REFLEX): HIV Screen 4th Generation wRfx: NONREACTIVE

## 2022-02-06 LAB — SURGICAL PATHOLOGY

## 2022-02-06 MED ORDER — DOCUSATE SODIUM 100 MG PO CAPS
100.0000 mg | ORAL_CAPSULE | Freq: Every day | ORAL | Status: AC | PRN
Start: 2022-02-06 — End: ?

## 2022-02-06 MED ORDER — POLYETHYLENE GLYCOL 3350 17 G PO PACK: 17.0000 g | PACK | Freq: Every day | ORAL | Status: AC | PRN

## 2022-02-06 MED ORDER — ACETAMINOPHEN 500 MG PO TABS: 1000.0000 mg | ORAL_TABLET | Freq: Four times a day (QID) | ORAL | 0 refills | Status: AC | PRN

## 2022-02-06 NOTE — Progress Notes (Signed)
Transition of Care (TOC) Screening Note ? ?Patient Details  ?Name: Kathleen Orr ?Date of Birth: 1956/10/11 ? ?Transition of Care (TOC) CM/SW Contact:    ?Ewing Schlein, LCSW ?Phone Number: ?02/06/2022, 10:44 AM ? ?Transition of Care Department [facility]) has reviewed patient and no TOC needs have been identified at this time. We will continue to monitor patient advancement through interdisciplinary progression rounds. If new patient transition needs arise, please place a TOC consult. ?

## 2022-02-06 NOTE — Discharge Summary (Signed)
[facility] ?Discharge Summary  ? ?Patient ID: ?Kathleen Orr ?MRN: [id number] ?DOB/AGE: 1956-08-13 66 y.o. ? ?Admit date: 02/05/2022 ?Discharge date: 02/06/2022 ? ?Admitting Diagnosis: ?Acute cholecystitis  ? ?Discharge Diagnosis ?Patient Active Problem List  ? Diagnosis Date Noted  ? Cholelithiasis with cholecystitis 02/05/2022  ?S/P laparoscopic cholecystectomy  ? ?Consultants ?None  ? ?Imaging: ?DG Cholangiogram Operative ? ?Result Date: 02/05/2022 ?CLINICAL DATA:  RIGHT upper quadrant pain.  Gallstones. EXAM: INTRAOPERATIVE CHOLANGIOGRAM COMPARISON:  US Abdomen, 02/05/2022.  CT AP, 02/05/2022. FLUOROSCOPY: Exposure Index (as provided by the fluoroscopic device): 12 mGy Kerma FINDINGS: Multiple, limited oblique planar images of the RIGHT upper quadrant obtained C-arm. Images demonstrating laparoscopic instrumentation, cystic duct cannulation and antegrade cholangiogram. Free spillage of contrast into the duodenum. No biliary ductal dilation. No evidence of biliary filling defect is demonstrated. IMPRESSION: Fluoroscopic imaging for intraoperative cholangiogram. No biliary ductal dilation nor filling defect is demonstrated. For complete description of intra procedural findings, please see performing service dictation. Electronically Signed   By: Roanna Banning M.D.   On: 02/05/2022 14:05  ? ?CT Abdomen Pelvis W Contrast ? ?Result Date: 02/05/2022 ?CLINICAL DATA:  Abdominal pain, nausea/vomiting EXAM: CT ABDOMEN AND PELVIS WITH CONTRAST TECHNIQUE: Multidetector CT imaging of the abdomen and pelvis was performed using the standard protocol following bolus administration of intravenous contrast. RADIATION DOSE REDUCTION: This exam was performed according to the departmental dose-optimization program which includes automated exposure control, adjustment of the mA and/or kV according to patient size and/or use of iterative reconstruction technique. CONTRAST:  OMNIPAQUE IOHEXOL 300 MG/ML  SOLN COMPARISON:   09/15/2021 FINDINGS: Lower chest: Lung bases are clear. Hepatobiliary: Liver is within normal limits. Cholelithiasis (series 2/image 28), without associated inflammatory changes. No intrahepatic or extrahepatic ductal dilatation. Pancreas: Within normal limits. Spleen: Within normal limits. Adrenals/Urinary Tract: Adrenal glands within normal limits. Kidneys are within normal limits.  No hydronephrosis. Bladder is within normal limits. Stomach/Bowel: Stomach is notable for a tiny hiatal hernia. No evidence of bowel obstruction. Normal appendix (coronal image 51). Mild sigmoid diverticulosis, without evidence of diverticulitis. Vascular/Lymphatic: No evidence of abdominal aortic aneurysm. Atherosclerotic calcifications of the abdominal aorta and branch vessels. No suspicious abdominopelvic lymphadenopathy. Reproductive: Uterus is within normal limits.  No adnexal masses. Other: No abdominopelvic ascites. Musculoskeletal: Mild degenerative changes at L5-S1. IMPRESSION: Cholelithiasis, without associated inflammatory changes. Otherwise negative CT abdomen/pelvis. Electronically Signed   By: Charline Bills M.D.   On: 02/05/2022 03:30  ? ?DG CHEST PORT 1 VIEW ? ?Result Date: 02/05/2022 ?CLINICAL DATA:  Epigastric pain EXAM: PORTABLE CHEST 1 VIEW COMPARISON:  Chest x-ray dated August 28, 2021 FINDINGS: Cardiac and mediastinal contours are within normal limits. Mild left basilar opacities, likely due to atelectasis. Lungs otherwise clear. No large pleural effusion or pneumothorax. IMPRESSION: Mild left basilar opacities which are likely due to atelectasis. Electronically Signed   By: Allegra Lai M.D.   On: 02/05/2022 11:23  ? ?US Abdomen Limited RUQ (LIVER/GB) ? ?Result Date: 02/05/2022 ?CLINICAL DATA:  Right upper quadrant pain EXAM: ULTRASOUND ABDOMEN LIMITED RIGHT UPPER QUADRANT COMPARISON:  CT from earlier the same day FINDINGS: Gallbladder: Multiple shadowing gallstones measured up to approximately 15 mm. Mild  gallbladder wall thickening a 14 mm but no focal tenderness or pericholecystic edema. Common bile duct: Diameter: 4 mm.  Where visualized, no filling defect. Liver: No focal lesion identified. Within normal limits in parenchymal echogenicity. Portal vein is patent on color Doppler imaging with normal direction of blood flow towards the liver. IMPRESSION: Multiple  gallstones. Mild gallbladder wall thickening but no tenderness or edema typical of acute cholecystitis. Electronically Signed   By: Tiburcio Pea M.D.   On: 02/05/2022 07:28   ? ?Procedures ?Dr. Darnell Level (02/05/22) - Laparoscopic Cholecystectomy with IOC ? ? ?Hospital Course:  ?Patient is a 66 year old female who presented to [facility] with abdominal pain.  Workup showed acute cholecystitis.  Patient was admitted and underwent procedure listed above.  Tolerated procedure well and was transferred to the floor.  Diet was advanced as tolerated.  On POD1, the patient was voiding well, tolerating diet, ambulating well, pain well controlled, vital signs stable, incisions c/d/i and felt stable for discharge home.  Patient will follow up in our office in 3 weeks and knows to call with questions or concerns.  She will call to confirm appointment date/time.   ? ?Physical Exam: ?General:  Alert, NAD, pleasant, comfortable ?Abd:  Soft, ND, mild tenderness, incisions C/D/I ? ?I or a member of my team have reviewed this patient in the Controlled Substance Database. ? ? ?Allergies as of 02/06/2022   ? ?   Reactions  ? Gabapentin Other (See Comments)  ? The patient did NOT like the way it made her feel AND it made her legs hurt  ? ?  ? ?  ?Medication List  ?  ? ?TAKE these medications   ? ?acetaminophen 500 MG tablet ?Commonly known as: TYLENOL ?Take 2 tablets (1,000 mg total) by mouth every 6 (six) hours as needed for mild pain or fever. ?  ?albuterol 108 (90 Base) MCG/ACT inhaler ?Commonly known as: VENTOLIN HFA ?Inhale 2 puffs into the lungs every 6 (six) hours as needed  for wheezing or shortness of breath. ?  ?docusate sodium 100 MG capsule ?Commonly known as: COLACE ?Take 1 capsule (100 mg total) by mouth daily as needed for mild constipation. ?  ?fluticasone 50 MCG/ACT nasal spray ?Commonly known as: FLONASE ?Place 1-2 sprays into both nostrils daily as needed for allergies or rhinitis. ?  ?fluticasone furoate-vilanterol 100-25 MCG/ACT Aepb ?Commonly known as: BREO ELLIPTA ?Inhale 1 puff into the lungs at bedtime. ?  ?hydrochlorothiazide 25 MG tablet ?Commonly known as: HYDRODIURIL ?Take 25 mg by mouth daily. ?  ?ibuprofen 800 MG tablet ?Commonly known as: ADVIL ?Take 800 mg by mouth daily as needed for headache or mild pain. ?  ?lisinopril 20 MG tablet ?Commonly known as: ZESTRIL ?Take 20 mg by mouth daily. ?  ?NON FORMULARY ?Take 1 capsule by mouth See admin instructions. OmegaXL Joint Pain Relief & Inflammation Supplement capsules- Take 1 capsule by mouth in the morning and evening ?  ?omeprazole 20 MG capsule ?Commonly known as: PRILOSEC ?Take 1 capsule (20 mg total) by mouth 2 (two) times daily before a meal. ?What changed: when to take this ?  ?polyethylene glycol 17 g packet ?Commonly known as: MIRALAX / GLYCOLAX ?Take 17 g by mouth daily as needed for mild constipation. ?  ?tiZANidine 4 MG tablet ?Commonly known as: ZANAFLEX ?Take 4 mg by mouth 3 (three) times daily as needed for muscle spasms. ?  ? ?  ? ? ? ? Follow-up Information   ? ? [facility], [facility]. Schedule an appointment as soon as possible for a visit in 3 week(s).   ?Specialty: General Surgery ?Why: Please call to confirm your appointment date and time. We are working hard to make this for you. Please bring a copy of your photo ID and insurance card. Please arrival 30 minutes prior to  your appointment for paperwork. ?Contact information: ?[address] ?[address] ?[place] [address] ?[phone] ? ? ?  ?  ? ? Raymon MuttonSmith, Fred A Jr., FNP Follow up.   ?Specialty: Family Medicine ?Contact information: ?[employer] ?[place] [address] ?[phone] ? ? ?  ?  ? ?  ?  ? ?  ? ? ?Signed: ?Juliet RudeKelly R Lonisha Bobby , PA-C ?[facility] ?02/06/2022, 9:27 AM ?Please see Amion for pager number during day hou

## 2022-02-06 NOTE — Plan of Care (Signed)
?  Problem: Education: ?Goal: Required Educational Video(s) ?Outcome: Adequate for Discharge ?  ?Problem: Clinical Measurements: ?Goal: Ability to maintain clinical measurements within normal limits will improve ?Outcome: Adequate for Discharge ?Goal: Postoperative complications will be avoided or minimized ?Outcome: Adequate for Discharge ?  ?Problem: Skin Integrity: ?Goal: Demonstration of wound healing without infection will improve ?Outcome: Adequate for Discharge ?  ?Problem: Clinical Measurements: ?Goal: Ability to maintain clinical measurements within normal limits will improve ?Outcome: Adequate for Discharge ?Goal: Will remain free from infection ?Outcome: Adequate for Discharge ?Goal: Diagnostic test results will improve ?Outcome: Adequate for Discharge ?Goal: Respiratory complications will improve ?Outcome: Adequate for Discharge ?Goal: Cardiovascular complication will be avoided ?Outcome: Adequate for Discharge ?  ?Problem: Activity: ?Goal: Risk for activity intolerance will decrease ?Outcome: Adequate for Discharge ?  ?Problem: Pain Managment: ?Goal: General experience of comfort will improve ?Outcome: Adequate for Discharge ?  ?

## 2022-02-09 ENCOUNTER — Other Ambulatory Visit: Payer: Self-pay | Admitting: Family

## 2022-02-09 DIAGNOSIS — Z780 Asymptomatic menopausal state: Secondary | ICD-10-CM

## 2022-03-08 ENCOUNTER — Other Ambulatory Visit: Payer: Medicare Other

## 2022-03-15 ENCOUNTER — Ambulatory Visit
Admission: RE | Admit: 2022-03-15 | Discharge: 2022-03-15 | Disposition: A | Discharge status: Home / Self Care | Payer: Medicare Other | Source: Ambulatory Visit | Attending: Family | Admitting: Family

## 2022-03-15 DIAGNOSIS — Z780 Asymptomatic menopausal state: Secondary | ICD-10-CM

## 2022-09-13 ENCOUNTER — Encounter (HOSPITAL_BASED_OUTPATIENT_CLINIC_OR_DEPARTMENT_OTHER): Payer: Self-pay | Admitting: Emergency Medicine

## 2022-09-13 ENCOUNTER — Emergency Department (HOSPITAL_BASED_OUTPATIENT_CLINIC_OR_DEPARTMENT_OTHER)
Admission: EM | Admit: 2022-09-13 | Discharge: 2022-09-14 | Disposition: A | Discharge status: Home / Self Care | Payer: Medicare Other | Attending: Emergency Medicine | Admitting: Emergency Medicine

## 2022-09-13 ENCOUNTER — Other Ambulatory Visit: Payer: Self-pay

## 2022-09-13 DIAGNOSIS — R0789 Other chest pain: Secondary | ICD-10-CM

## 2022-09-13 DIAGNOSIS — U071 COVID-19: Secondary | ICD-10-CM | POA: Insufficient documentation

## 2022-09-13 DIAGNOSIS — R1031 Right lower quadrant pain: Secondary | ICD-10-CM | POA: Diagnosis not present

## 2022-09-13 DIAGNOSIS — Z79899 Other long term (current) drug therapy: Secondary | ICD-10-CM | POA: Insufficient documentation

## 2022-09-13 NOTE — ED Triage Notes (Signed)
Pt is c/o her right side hurting  Pt states it started in her chest like she pulled a muscle then it moved down into her abdomen like she has gas  Pt adds she has pain in the right side of her back up near her shoulder area  Pt states that pain is also like she pulled something  Pt states she has been burping but has not been passing gas but did have a BM this morning  Pt also is c/o congestion, green in color

## 2022-09-14 ENCOUNTER — Emergency Department (HOSPITAL_BASED_OUTPATIENT_CLINIC_OR_DEPARTMENT_OTHER): Payer: Medicare Other

## 2022-09-14 ENCOUNTER — Encounter (HOSPITAL_BASED_OUTPATIENT_CLINIC_OR_DEPARTMENT_OTHER): Payer: Self-pay | Admitting: Emergency Medicine

## 2022-09-14 DIAGNOSIS — U071 COVID-19: Secondary | ICD-10-CM | POA: Diagnosis not present

## 2022-09-14 LAB — URINALYSIS, MICROSCOPIC (REFLEX)

## 2022-09-14 LAB — URINALYSIS, ROUTINE W REFLEX MICROSCOPIC
Bilirubin Urine: NEGATIVE
Glucose, UA: NEGATIVE mg/dL
Ketones, ur: NEGATIVE mg/dL
Nitrite: NEGATIVE
Protein, ur: NEGATIVE mg/dL
Specific Gravity, Urine: 1.015 (ref 1.005–1.030)
pH: 7 (ref 5.0–8.0)

## 2022-09-14 LAB — COMPREHENSIVE METABOLIC PANEL
ALT: 15 U/L (ref 0–44)
AST: 21 U/L (ref 15–41)
Albumin: 3.9 g/dL (ref 3.5–5.0)
Alkaline Phosphatase: 86 U/L (ref 38–126)
Anion gap: 6 (ref 5–15)
BUN: 23 mg/dL (ref 8–23)
CO2: 24 mmol/L (ref 22–32)
Calcium: 9.6 mg/dL (ref 8.9–10.3)
Chloride: 108 mmol/L (ref 98–111)
Creatinine, Ser: 0.71 mg/dL (ref 0.44–1.00)
GFR, Estimated: >60 mL/min (ref >60)
Glucose, Bld: 115 mg/dL — ABNORMAL HIGH (ref 70–99)
Potassium: 3.8 mmol/L (ref 3.5–5.1)
Sodium: 138 mmol/L (ref 135–145)
Total Bilirubin: 0.4 mg/dL (ref 0.3–1.2)
Total Protein: 7 g/dL (ref 6.5–8.1)

## 2022-09-14 LAB — CBC WITH DIFFERENTIAL/PLATELET
Abs Immature Granulocytes: 0.02 10*3/uL (ref 0.00–0.07)
Basophils Absolute: 0.1 10*3/uL (ref 0.0–0.1)
Basophils Relative: 1 %
Eosinophils Absolute: 0.5 10*3/uL (ref 0.0–0.5)
Eosinophils Relative: 7 %
HCT: 40.2 % (ref 36.0–46.0)
Hemoglobin: 12.8 g/dL (ref 12.0–15.0)
Immature Granulocytes: 0 %
Lymphocytes Relative: 38 %
Lymphs Abs: 2.6 10*3/uL (ref 0.7–4.0)
MCH: 27.9 pg (ref 26.0–34.0)
MCHC: 31.8 g/dL (ref 30.0–36.0)
MCV: 87.8 fL (ref 80.0–100.0)
Monocytes Absolute: 0.7 10*3/uL (ref 0.1–1.0)
Monocytes Relative: 10 %
Neutro Abs: 3 10*3/uL (ref 1.7–7.7)
Neutrophils Relative %: 44 %
Platelets: 197 10*3/uL (ref 150–400)
RBC: 4.58 MIL/uL (ref 3.87–5.11)
RDW: 13.6 % (ref 11.5–15.5)
WBC: 6.9 10*3/uL (ref 4.0–10.5)
nRBC: 0 % (ref 0.0–0.2)

## 2022-09-14 LAB — RESP PANEL BY RT-PCR (FLU A&B, COVID) ARPGX2
Influenza A by PCR: NEGATIVE
Influenza B by PCR: NEGATIVE
SARS Coronavirus 2 by RT PCR: POSITIVE — AB

## 2022-09-14 LAB — LIPASE, BLOOD: Lipase: 32 U/L (ref 11–51)

## 2022-09-14 LAB — TROPONIN I (HIGH SENSITIVITY): Troponin I (High Sensitivity): 5 ng/L (ref <18)

## 2022-09-14 MED ORDER — IOHEXOL 300 MG/ML  SOLN
100.0000 mL | Freq: Once | INTRAMUSCULAR | Status: AC | PRN
  Administered 2022-09-14: 100 mL via INTRAVENOUS

## 2022-09-14 MED ORDER — TIZANIDINE HCL 2 MG PO TABS: 2.0000 mg | ORAL_TABLET | Freq: Once | ORAL | Status: DC

## 2022-09-14 MED ORDER — KETOROLAC TROMETHAMINE 15 MG/ML IJ SOLN
15.0000 mg | Freq: Once | INTRAMUSCULAR | Status: AC
  Administered 2022-09-14: 15 mg via INTRAVENOUS
  Filled 2022-09-14: qty 1

## 2022-09-14 NOTE — ED Provider Notes (Signed)
[facility] EMERGENCY DEPARTMENT Provider Note   CSN: [id number] Arrival date & time: 09/13/22  2343     History  Chief Complaint  Patient presents with   side pain     Kathleen Orr is a 66 y.o. female.  HPI Kathleen Orr is a 66 y.o. female who presents to the Emergency Department complaining of side pain.  She presents to the emergency department for evaluation of right side pain that started Thursday morning.  Initially she felt like she had slept the wrong way and she had pain across her right chest that was described as a sharp pain that is worse with movement.  She took a muscle relaxer at home without significant improvement in her symptoms.  Later in the day she developed pain in her right abdomen and right groin and developed dysuria.  No reports of recent injuries.  No fever, nausea, vomiting, leg swelling or pain.  She does report about 1 week of nasal congestion with a very mild cough.  No shortness of breath.   Hx/o copd, cholecystectomy, htn, DM.   No hx/o DVT/PE.      Home Medications Prior to Admission medications   Medication Sig Start Date End Date Taking? Authorizing Provider  acetaminophen (TYLENOL) 500 MG tablet Take 2 tablets (1,000 mg total) by mouth every 6 (six) hours as needed for mild pain or fever. 02/06/22   Norm Parcel, PA-C  albuterol (VENTOLIN HFA) 108 (90 Base) MCG/ACT inhaler Inhale 2 puffs into the lungs every 6 (six) hours as needed for wheezing or shortness of breath.    [provider]  docusate sodium (COLACE) 100 MG capsule Take 1 capsule (100 mg total) by mouth daily as needed for mild constipation. 02/06/22   Norm Parcel, PA-C  fluticasone (FLONASE) 50 MCG/ACT nasal spray Place 1-2 sprays into both nostrils daily as needed for allergies or rhinitis.    [provider]  fluticasone furoate-vilanterol (BREO ELLIPTA) 100-25 MCG/ACT AEPB Inhale 1 puff into the lungs at bedtime. 11/10/21   [provider]   hydrochlorothiazide (HYDRODIURIL) 25 MG tablet Take 25 mg by mouth daily. Patient not taking: Reported on 02/05/2022    [provider]  ibuprofen (ADVIL) 800 MG tablet Take 800 mg by mouth daily as needed for headache or mild pain.    [provider]  lisinopril (ZESTRIL) 20 MG tablet Take 20 mg by mouth daily. 12/08/21   [provider]  NON FORMULARY Take 1 capsule by mouth See admin instructions. OmegaXL Joint Pain Relief & Inflammation Supplement capsules- Take 1 capsule by mouth in the morning and evening    [provider]  omeprazole (PRILOSEC) 20 MG capsule Take 1 capsule (20 mg total) by mouth 2 (two) times daily before a meal. Patient taking differently: Take 20 mg by mouth daily before breakfast. 05/21/20   Domenic Moras, PA-C  polyethylene glycol (MIRALAX / GLYCOLAX) 17 g packet Take 17 g by mouth daily as needed for mild constipation. 02/06/22   Norm Parcel, PA-C  tiZANidine (ZANAFLEX) 4 MG tablet Take 4 mg by mouth 3 (three) times daily as needed for muscle spasms. 12/14/21   [provider]      Allergies    Gabapentin    Review of Systems   Review of Systems  All other systems reviewed and are negative.   Physical Exam Updated Vital Signs BP 133/80 (BP Location: Left Arm)   Pulse 67   Temp 98.2 F (36.8  C) (Oral)   Resp 20   Ht 5\' 5"  (1.651 m)   Wt 84.4 kg   SpO2 99%   BMI 30.95 kg/m  Physical Exam Vitals and nursing note reviewed.  Constitutional:      Appearance: She is well-developed.  HENT:     Head: Normocephalic and atraumatic.  Cardiovascular:     Rate and Rhythm: Normal rate and regular rhythm.     Heart sounds: No murmur heard. Pulmonary:     Effort: Pulmonary effort is normal. No respiratory distress.     Breath sounds: Normal breath sounds.     Comments: Right-sided chest wall tenderness to palpation without overlying lesions Abdominal:     Palpations: Abdomen is soft.     Tenderness: There is no  guarding or rebound.     Comments: Right-sided abdominal tenderness, greatest in the right lower quadrant.  No palpable hernia.  Musculoskeletal:        General: No tenderness.     Comments: 2+ femoral and DP pulses bilaterally.  Skin:    General: Skin is warm and dry.  Neurological:     Mental Status: She is alert and oriented to person, place, and time.  Psychiatric:        Behavior: Behavior normal.     ED Results / Procedures / Treatments   Labs (all labs ordered are listed, but only abnormal results are displayed) Labs Reviewed  RESP PANEL BY RT-PCR (FLU A&B, COVID) ARPGX2 - Abnormal; Notable for the following components:      Result Value   SARS Coronavirus 2 by RT PCR POSITIVE (*)    All other components within normal limits  COMPREHENSIVE METABOLIC PANEL - Abnormal; Notable for the following components:   Glucose, Bld 115 (*)    All other components within normal limits  URINALYSIS, ROUTINE W REFLEX MICROSCOPIC - Abnormal; Notable for the following components:   APPearance HAZY (*)    Hgb urine dipstick TRACE (*)    Leukocytes,Ua SMALL (*)    All other components within normal limits  URINALYSIS, MICROSCOPIC (REFLEX) - Abnormal; Notable for the following components:   Bacteria, UA FEW (*)    All other components within normal limits  URINE CULTURE  LIPASE, BLOOD  CBC WITH DIFFERENTIAL/PLATELET  TROPONIN I (HIGH SENSITIVITY)    EKG EKG Interpretation  Date/Time:  Friday September 14 2022 01:06:20 EST Ventricular Rate:  69 PR Interval:  171 QRS Duration: 87 QT Interval:  431 QTC Calculation: 462 R Axis:   -9 Text Interpretation: Sinus rhythm Confirmed by Quintella Reichert [phone]) on 09/14/2022 1:23:20 AM  Radiology CT Abdomen Pelvis W Contrast  Result Date: 09/14/2022 CLINICAL DATA:  Right lower quadrant abdominal pain EXAM: CT ABDOMEN AND PELVIS WITH CONTRAST TECHNIQUE: Multidetector CT imaging of the abdomen and pelvis was performed using the standard  protocol following bolus administration of intravenous contrast. RADIATION DOSE REDUCTION: This exam was performed according to the departmental dose-optimization program which includes automated exposure control, adjustment of the mA and/or kV according to patient size and/or use of iterative reconstruction technique. CONTRAST:  167mL OMNIPAQUE IOHEXOL 300 MG/ML  SOLN COMPARISON:  02/05/2022 FINDINGS: Lower Chest: Normal. Hepatobiliary: Normal hepatic contours. No intra- or extrahepatic biliary dilatation. Status post cholecystectomy. Pancreas: Normal pancreas. No ductal dilatation or peripancreatic fluid collection. Spleen: Normal. Adrenals/Urinary Tract: The adrenal glands are normal. No hydronephrosis, nephroureterolithiasis or solid renal mass. The urinary bladder is normal for degree of distention Stomach/Bowel: There is no hiatal hernia. Normal duodenal course  and caliber. No small bowel dilatation or inflammation. No focal colonic abnormality. The appendix is not visualized. No right lower quadrant inflammation or free fluid. Vascular/Lymphatic: There is calcific atherosclerosis of the abdominal aorta. No lymphadenopathy. Reproductive: Status post hysterectomy. No adnexal mass. Other: None. Musculoskeletal: No bony spinal canal stenosis or focal osseous abnormality. IMPRESSION: 1. No acute abnormality of the abdomen or pelvis. Aortic Atherosclerosis (ICD10-I70.0). Electronically Signed   By: Deatra Robinson M.D.   On: 09/14/2022 02:21   DG Chest 2 View  Result Date: 09/14/2022 CLINICAL DATA:  Chest pain EXAM: CHEST - 2 VIEW COMPARISON:  Radiographs 02/05/2022 FINDINGS: The heart size and mediastinal contours are within normal limits. Calcified tortuous aorta. Both lungs are clear. The visualized skeletal structures are unremarkable. IMPRESSION: No active cardiopulmonary disease. Electronically Signed   By: Minerva Fester M.D.   On: 09/14/2022 00:44    Procedures Procedures    Medications Ordered in  ED Medications  ketorolac (TORADOL) 15 MG/ML injection 15 mg (has no administration in time range)  iohexol (OMNIPAQUE) 300 MG/ML solution 100 mL (100 mLs Intravenous Contrast Given 09/14/22 0202)    ED Course/ Medical Decision Making/ A&P                           Medical Decision Making Amount and/or Complexity of Data Reviewed Labs: ordered. Radiology: ordered.  Risk Prescription drug management.   Patient here for evaluation of right-sided chest pain and abdominal pain, reproducible on examination.  She is nontoxic-appearing with no respiratory distress.  She has been experiencing 1 week of nasal congestion and cough.  EKG is without acute ischemic changes and troponin is negative.  UA is not consistent with UTI.  CBC, CMP without acute abnormality.  CT abdomen pelvis was obtained given her right lower quadrant pain-this is negative for acute intra-abdominal process.  She is positive for COVID-19 infection.  Discussed with patient findings of studies.  Discussed her COVID-19 diagnosis.  Given that she has been symptomatic with upper respiratory symptoms for 1 week she is not an antiviral candidate.  Discussed masking in setting of COVID-19 infection.  In terms of her chest and abdominal pain, this appears to be musculoskeletal in nature.  Discussed OTC analgesics such as ibuprofen and acetaminophen as well as continuing her tizanidine as needed.  Current clinical picture is not consistent with ACS, PE, dissection.       Final Clinical Impression(s) / ED Diagnoses Final diagnoses:  Chest wall pain  Right lower quadrant abdominal pain  COVID-19 virus infection    Rx / DC Orders ED Discharge Orders     None         Tilden Fossa, MD 09/14/22 0532

## 2022-09-15 LAB — URINE CULTURE

## 2022-12-29 ENCOUNTER — Other Ambulatory Visit: Payer: Self-pay

## 2022-12-29 ENCOUNTER — Encounter (HOSPITAL_BASED_OUTPATIENT_CLINIC_OR_DEPARTMENT_OTHER): Payer: Self-pay | Admitting: Urology

## 2022-12-29 ENCOUNTER — Emergency Department (HOSPITAL_BASED_OUTPATIENT_CLINIC_OR_DEPARTMENT_OTHER)
Admission: EM | Admit: 2022-12-29 | Discharge: 2022-12-29 | Disposition: A | Discharge status: Home / Self Care | Payer: 59 | Attending: Emergency Medicine | Admitting: Emergency Medicine

## 2022-12-29 DIAGNOSIS — E119 Type 2 diabetes mellitus without complications: Secondary | ICD-10-CM | POA: Insufficient documentation

## 2022-12-29 DIAGNOSIS — J45909 Unspecified asthma, uncomplicated: Secondary | ICD-10-CM | POA: Diagnosis not present

## 2022-12-29 DIAGNOSIS — Z79899 Other long term (current) drug therapy: Secondary | ICD-10-CM | POA: Insufficient documentation

## 2022-12-29 DIAGNOSIS — J449 Chronic obstructive pulmonary disease, unspecified: Secondary | ICD-10-CM | POA: Insufficient documentation

## 2022-12-29 DIAGNOSIS — I10 Essential (primary) hypertension: Secondary | ICD-10-CM

## 2022-12-29 DIAGNOSIS — Z7951 Long term (current) use of inhaled steroids: Secondary | ICD-10-CM | POA: Diagnosis not present

## 2022-12-29 LAB — CBC
HCT: 42.3 % (ref 36.0–46.0)
Hemoglobin: 13.7 g/dL (ref 12.0–15.0)
MCH: 28.5 pg (ref 26.0–34.0)
MCHC: 32.4 g/dL (ref 30.0–36.0)
MCV: 87.9 fL (ref 80.0–100.0)
Platelets: 208 10*3/uL (ref 150–400)
RBC: 4.81 MIL/uL (ref 3.87–5.11)
RDW: 13.4 % (ref 11.5–15.5)
WBC: 5 10*3/uL (ref 4.0–10.5)
nRBC: 0 % (ref 0.0–0.2)

## 2022-12-29 LAB — BASIC METABOLIC PANEL
Anion gap: 8 (ref 5–15)
BUN: 15 mg/dL (ref 8–23)
CO2: 24 mmol/L (ref 22–32)
Calcium: 8.9 mg/dL (ref 8.9–10.3)
Chloride: 106 mmol/L (ref 98–111)
Creatinine, Ser: 0.82 mg/dL (ref 0.44–1.00)
GFR, Estimated: >60 mL/min (ref >60)
Glucose, Bld: 144 mg/dL — ABNORMAL HIGH (ref 70–99)
Potassium: 3.4 mmol/L — ABNORMAL LOW (ref 3.5–5.1)
Sodium: 138 mmol/L (ref 135–145)

## 2022-12-29 MED ORDER — HYDRALAZINE HCL 20 MG/ML IJ SOLN: 10.0000 mg | Freq: Once | INTRAMUSCULAR | Status: DC

## 2022-12-29 NOTE — Discharge Instructions (Addendum)
Your blood pressure improved without additional medications.  Continue your home medication regimen and follow-up with your primary care doctor as planned

## 2022-12-29 NOTE — ED Triage Notes (Signed)
Pt states BP elevated 200/108 at home Seen at PCP yesterday and was given meds at office  [place], states mild heaviness in head Denies any other symptoms  Took BP meds today (increased dose)

## 2022-12-29 NOTE — ED Notes (Signed)
D/c paperwork reviewed with pt, including follow up care.  All questions and/or concerns addressed at time of d/c.  No further needs expressed. . Pt verbalized understanding, Ambulatory without assistance to ED exit, NAD.   

## 2022-12-29 NOTE — ED Provider Notes (Signed)
[place] [facility] Provider Note   CSN: [id number] Arrival date & time: 12/29/22  1136     History  Chief Complaint  Patient presents with   Hypertension    Kathleen Orr is a 67 y.o. female.   Hypertension     Patient has a history of diabetes hypertension COPD, asthma arthritis.  Patient presents ED for evaluation of hypertension.  Patient states she went to her doctor's office yesterday.  She was there for routine checkup and they noted her blood pressure was elevated with systolics over A999333 and diastolics over [id number].  Patient was instructed to increase her medications.  Patient rechecked her blood pressure today and it was elevated.  She went to the fire station and they showed systolics above A999333 and diastolics above 123XX123.  They recommended she go to the ED for evaluation.  Patient's not have any complaints of chest pain.  No shortness of breath.  She denies any numbness or weakness.  She says she might have a mild headache but nothing significant.  She states she feels fine other than her elevated blood pressure  Home Medications Prior to Admission medications   Medication Sig Start Date End Date Taking? Authorizing Provider  acetaminophen (TYLENOL) 500 MG tablet Take 2 tablets (1,000 mg total) by mouth every 6 (six) hours as needed for mild pain or fever. 02/06/22   Norm Parcel, PA-C  albuterol (VENTOLIN HFA) 108 (90 Base) MCG/ACT inhaler Inhale 2 puffs into the lungs every 6 (six) hours as needed for wheezing or shortness of breath.    [provider]  docusate sodium (COLACE) 100 MG capsule Take 1 capsule (100 mg total) by mouth daily as needed for mild constipation. 02/06/22   Norm Parcel, PA-C  fluticasone (FLONASE) 50 MCG/ACT nasal spray Place 1-2 sprays into both nostrils daily as needed for allergies or rhinitis.    [provider]  fluticasone furoate-vilanterol (BREO ELLIPTA) 100-25 MCG/ACT AEPB Inhale 1 puff  into the lungs at bedtime. 11/10/21   [provider]  hydrochlorothiazide (HYDRODIURIL) 25 MG tablet Take 25 mg by mouth daily. Patient not taking: Reported on 02/05/2022    [provider]  ibuprofen (ADVIL) 800 MG tablet Take 800 mg by mouth daily as needed for headache or mild pain.    [provider]  lisinopril (ZESTRIL) 20 MG tablet Take 20 mg by mouth daily. 12/08/21   [provider]  NON FORMULARY Take 1 capsule by mouth See admin instructions. OmegaXL Joint Pain Relief & Inflammation Supplement capsules- Take 1 capsule by mouth in the morning and evening    [provider]  omeprazole (PRILOSEC) 20 MG capsule Take 1 capsule (20 mg total) by mouth 2 (two) times daily before a meal. Patient taking differently: Take 20 mg by mouth daily before breakfast. 05/21/20   Domenic Moras, PA-C  polyethylene glycol (MIRALAX / GLYCOLAX) 17 g packet Take 17 g by mouth daily as needed for mild constipation. 02/06/22   Norm Parcel, PA-C  tiZANidine (ZANAFLEX) 4 MG tablet Take 4 mg by mouth 3 (three) times daily as needed for muscle spasms. 12/14/21   [provider]      Allergies    Gabapentin    Review of Systems   Review of Systems  Physical Exam Updated Vital Signs BP (!) 156/87   Pulse 70   Temp 98.1 F (36.7 C) (Oral)   Resp 14   Ht 1.651 m (5'  5")   Wt 84.4 kg   SpO2 97%   BMI 30.96 kg/m  Physical Exam Vitals and nursing note reviewed.  Constitutional:      General: She is not in acute distress.    Appearance: She is well-developed.  HENT:     Head: Normocephalic and atraumatic.     Right Ear: External ear normal.     Left Ear: External ear normal.  Eyes:     General: No scleral icterus.       Right eye: No discharge.        Left eye: No discharge.     Conjunctiva/sclera: Conjunctivae normal.  Neck:     Trachea: No tracheal deviation.  Cardiovascular:     Rate and Rhythm: Normal rate and regular rhythm.  Pulmonary:      Effort: Pulmonary effort is normal. No respiratory distress.     Breath sounds: Normal breath sounds. No stridor. No wheezing or rales.  Abdominal:     General: Bowel sounds are normal. There is no distension.     Palpations: Abdomen is soft.     Tenderness: There is no abdominal tenderness. There is no guarding or rebound.  Musculoskeletal:        General: No tenderness or deformity.     Cervical back: Neck supple.  Skin:    General: Skin is warm and dry.     Findings: No rash.  Neurological:     General: No focal deficit present.     Mental Status: She is alert.     Cranial Nerves: No cranial nerve deficit, dysarthria or facial asymmetry.     Sensory: No sensory deficit.     Motor: No abnormal muscle tone or seizure activity.     Coordination: Coordination normal.  Psychiatric:        Mood and Affect: Mood normal.     ED Results / Procedures / Treatments   Labs (all labs ordered are listed, but only abnormal results are displayed) Labs Reviewed  BASIC METABOLIC PANEL - Abnormal; Notable for the following components:      Result Value   Potassium 3.4 (*)    Glucose, Bld 144 (*)    All other components within normal limits  CBC    EKG EKG Interpretation  Date/Time:  Saturday December 29 2022 11:54:33 EDT Ventricular Rate:  65 PR Interval:  169 QRS Duration: 94 QT Interval:  403 QTC Calculation: 419 R Axis:   -18 Text Interpretation: Sinus rhythm Left ventricular hypertrophy No significant change since last tracing Confirmed by Dorie Rank [phone]) on 12/29/2022 11:59:07 AM  Radiology No results found.  Procedures Procedures    Medications Ordered in ED Medications  hydrALAZINE (APRESOLINE) injection 10 mg (10 mg Intravenous Not Given 12/29/22 1218)    ED Course/ Medical Decision Making/ A&P Clinical Course as of 12/29/22 1321  Sat Dec 29, 2022  1218 Patient blood pressure is now 156/87 without medications.  Will hold off on any additional treatment [JK]  [id number]  CBC and metabolic panel unremarkable [JK]    Clinical Course User Index [JK] Dorie Rank, MD                             Medical Decision Making Problems Addressed: Hypertension, unspecified type: chronic illness or injury with exacerbation, progression, or side effects of treatment  Amount and/or Complexity of Data Reviewed Labs: ordered. Decision-making details documented in ED Course.  Risk Prescription drug  management.   Patient presented to the ED for evaluation of hypertension.  Patient with known history of same.  Saw her doctor yesterday it was very elevated.  Patient's medication regimen was changed.  Patient was noted to be hypertensive still today and the fire station recommend she come to the ED for evaluation.  Patient is asymptomatic.  Her laboratory tests are reassuring.  No signs of acute kidney injury.  Initially plan was to give patient additional dose of medications but her blood pressure decreased without intervention.  Will have her continue with the recommendations by her primary care doctor.  She does have plans to monitor her blood pressure and they will adjust her medications accordingly        Final Clinical Impression(s) / ED Diagnoses Final diagnoses:  Hypertension, unspecified type    Rx / DC Orders ED Discharge Orders     None         Dorie Rank, MD 12/29/22 1321

## 2023-01-28 ENCOUNTER — Encounter: Payer: Self-pay | Admitting: *Deleted

## 2023-03-19 DIAGNOSIS — M545 Low back pain, unspecified: Secondary | ICD-10-CM | POA: Insufficient documentation

## 2023-04-02 ENCOUNTER — Encounter (HOSPITAL_BASED_OUTPATIENT_CLINIC_OR_DEPARTMENT_OTHER): Payer: Self-pay | Admitting: Emergency Medicine

## 2023-04-02 ENCOUNTER — Emergency Department (HOSPITAL_BASED_OUTPATIENT_CLINIC_OR_DEPARTMENT_OTHER)
Admission: EM | Admit: 2023-04-02 | Discharge: 2023-04-03 | Disposition: A | Discharge status: Home / Self Care | Payer: 59 | Attending: Emergency Medicine | Admitting: Emergency Medicine

## 2023-04-02 ENCOUNTER — Other Ambulatory Visit: Payer: Self-pay

## 2023-04-02 DIAGNOSIS — E119 Type 2 diabetes mellitus without complications: Secondary | ICD-10-CM | POA: Insufficient documentation

## 2023-04-02 DIAGNOSIS — J449 Chronic obstructive pulmonary disease, unspecified: Secondary | ICD-10-CM | POA: Diagnosis not present

## 2023-04-02 DIAGNOSIS — Z79899 Other long term (current) drug therapy: Secondary | ICD-10-CM | POA: Insufficient documentation

## 2023-04-02 DIAGNOSIS — K529 Noninfective gastroenteritis and colitis, unspecified: Secondary | ICD-10-CM | POA: Insufficient documentation

## 2023-04-02 DIAGNOSIS — R1084 Generalized abdominal pain: Secondary | ICD-10-CM | POA: Diagnosis present

## 2023-04-02 LAB — COMPREHENSIVE METABOLIC PANEL
ALT: 19 U/L (ref 0–44)
AST: 20 U/L (ref 15–41)
Albumin: 4.1 g/dL (ref 3.5–5.0)
Alkaline Phosphatase: 62 U/L (ref 38–126)
Anion gap: 12 (ref 5–15)
BUN: 19 mg/dL (ref 8–23)
CO2: 22 mmol/L (ref 22–32)
Calcium: 9.5 mg/dL (ref 8.9–10.3)
Chloride: 102 mmol/L (ref 98–111)
Creatinine, Ser: 0.95 mg/dL (ref 0.44–1.00)
GFR, Estimated: >60 mL/min (ref >60)
Glucose, Bld: 144 mg/dL — ABNORMAL HIGH (ref 70–99)
Potassium: 3.6 mmol/L (ref 3.5–5.1)
Sodium: 136 mmol/L (ref 135–145)
Total Bilirubin: 0.7 mg/dL (ref 0.3–1.2)
Total Protein: 7.3 g/dL (ref 6.5–8.1)

## 2023-04-02 LAB — CBC
HCT: 42.3 % (ref 36.0–46.0)
Hemoglobin: 13.9 g/dL (ref 12.0–15.0)
MCH: 28.8 pg (ref 26.0–34.0)
MCHC: 32.9 g/dL (ref 30.0–36.0)
MCV: 87.8 fL (ref 80.0–100.0)
Platelets: 195 10*3/uL (ref 150–400)
RBC: 4.82 MIL/uL (ref 3.87–5.11)
RDW: 13.3 % (ref 11.5–15.5)
WBC: 6.2 10*3/uL (ref 4.0–10.5)
nRBC: 0 % (ref 0.0–0.2)

## 2023-04-02 LAB — LIPASE, BLOOD: Lipase: 34 U/L (ref 11–51)

## 2023-04-02 MED ORDER — SODIUM CHLORIDE 0.9 % IV BOLUS
1000.0000 mL | Freq: Once | INTRAVENOUS | Status: AC
  Administered 2023-04-03: 1000 mL via INTRAVENOUS

## 2023-04-02 MED ORDER — ONDANSETRON HCL 4 MG/2ML IJ SOLN
4.0000 mg | Freq: Once | INTRAMUSCULAR | Status: AC
  Administered 2023-04-03: 4 mg via INTRAVENOUS
  Filled 2023-04-02: qty 2

## 2023-04-02 NOTE — ED Provider Notes (Signed)
[facility] AT [facility]  Provider Note  CSN: [id number] Arrival date & time: 04/02/23 2237  History Chief Complaint  Patient presents with   Abdominal Pain    Kathleen Orr is a 67 y.o. female with history of DM, COPD, Prior cholecystectomy reports onset of watery diarrhea last night has had some nausea but no vomiting. Multiple episodes of clear/yellow diarrhea, non bloody. She reports diffuse abdominal pain during the day today. She think she may have gotten food poisoning.    Home Medications Prior to Admission medications   Medication Sig Start Date End Date Taking? Authorizing Provider  amoxicillin-clavulanate (AUGMENTIN) 875-125 MG tablet Take 1 tablet by mouth every 12 (twelve) hours. 04/03/23  Yes Pollyann Savoy, MD  ondansetron (ZOFRAN-ODT) 4 MG disintegrating tablet Take 1 tablet (4 mg total) by mouth every 8 (eight) hours as needed for nausea or vomiting. 04/03/23  Yes Pollyann Savoy, MD  acetaminophen (TYLENOL) 500 MG tablet Take 2 tablets (1,000 mg total) by mouth every 6 (six) hours as needed for mild pain or fever. 02/06/22   Juliet Rude, PA-C  albuterol (VENTOLIN HFA) 108 (90 Base) MCG/ACT inhaler Inhale 2 puffs into the lungs every 6 (six) hours as needed for wheezing or shortness of breath.    [provider]  docusate sodium (COLACE) 100 MG capsule Take 1 capsule (100 mg total) by mouth daily as needed for mild constipation. 02/06/22   Juliet Rude, PA-C  fluticasone (FLONASE) 50 MCG/ACT nasal spray Place 1-2 sprays into both nostrils daily as needed for allergies or rhinitis.    [provider]  fluticasone furoate-vilanterol (BREO ELLIPTA) 100-25 MCG/ACT AEPB Inhale 1 puff into the lungs at bedtime. 11/10/21   [provider]  hydrochlorothiazide (HYDRODIURIL) 25 MG tablet Take 25 mg by mouth daily. Patient not taking: Reported on 02/05/2022    [provider]  ibuprofen (ADVIL) 800 MG  tablet Take 800 mg by mouth daily as needed for headache or mild pain.    [provider]  lisinopril (ZESTRIL) 20 MG tablet Take 20 mg by mouth daily. 12/08/21   [provider]  NON FORMULARY Take 1 capsule by mouth See admin instructions. OmegaXL Joint Pain Relief & Inflammation Supplement capsules- Take 1 capsule by mouth in the morning and evening    [provider]  omeprazole (PRILOSEC) 20 MG capsule Take 1 capsule (20 mg total) by mouth 2 (two) times daily before a meal. Patient taking differently: Take 20 mg by mouth daily before breakfast. 05/21/20   Fayrene Helper, PA-C  polyethylene glycol (MIRALAX / GLYCOLAX) 17 g packet Take 17 g by mouth daily as needed for mild constipation. 02/06/22   Juliet Rude, PA-C  tiZANidine (ZANAFLEX) 4 MG tablet Take 4 mg by mouth 3 (three) times daily as needed for muscle spasms. 12/14/21   [provider]     Allergies    Gabapentin   Review of Systems   Review of Systems Please see HPI for pertinent positives and negatives  Physical Exam BP (!) 157/82 (BP Location: Right Arm)   Pulse 66   Temp 98.8 F (37.1 C)   Resp 20   Ht 5\' 5"  (1.651 m)   Wt 86.2 kg   SpO2 98%   BMI 31.62 kg/m   Physical Exam Vitals and nursing note reviewed.  Constitutional:      Appearance: Normal appearance.  HENT:     Head: Normocephalic and atraumatic.  Nose: Nose normal.     Mouth/Throat:     Mouth: Mucous membranes are moist.  Eyes:     Extraocular Movements: Extraocular movements intact.     Conjunctiva/sclera: Conjunctivae normal.  Cardiovascular:     Rate and Rhythm: Normal rate.  Pulmonary:     Effort: Pulmonary effort is normal.     Breath sounds: Normal breath sounds.  Abdominal:     General: Abdomen is flat.     Palpations: Abdomen is soft.     Tenderness: There is generalized abdominal tenderness. There is no guarding. Negative signs include Murphy's sign and McBurney's sign.  Musculoskeletal:         General: No swelling. Normal range of motion.     Cervical back: Neck supple.  Skin:    General: Skin is warm and dry.  Neurological:     General: No focal deficit present.     Mental Status: She is alert.  Psychiatric:        Mood and Affect: Mood normal.     ED Results / Procedures / Treatments   EKG None  Procedures Procedures  Medications Ordered in the ED Medications  amoxicillin-clavulanate (AUGMENTIN) 875-125 MG per tablet 1 tablet (has no administration in time range)  ondansetron (ZOFRAN) injection 4 mg (4 mg Intravenous Given 04/03/23 0019)  sodium chloride 0.9 % bolus 1,000 mL ( Intravenous Stopped 04/03/23 0029)  iohexol (OMNIPAQUE) 300 MG/ML solution 100 mL (100 mLs Intravenous Contrast Given 04/03/23 0057)    Initial Impression and Plan  Patient here with diffuse abdominal pain and diarrhea. Some nausea but no vomiting or fever. Some diffuse abdominal tenderness. Will check labs, send for CT. IVF and zofran for comfort.   ED Course   Clinical Course as of 04/03/23 0143  Tue Apr 02, 2023  2333 CBC is normal. CMP and lipase are unremarkable.  [CS]  Wed Apr 03, 2023  0123 I personally viewed the images from radiology studies and agree with radiologist interpretation: CT consistent with colitis which would explain her symptoms. Awaiting UA [CS]  0134 UA is normal.  [CS]  0142 Patient reports she is feeling better. Discussed results with her. Plan Rx for Zofran and Augmentin. PCP follow up, RTED for any other concerns.   [CS]    Clinical Course User Index [CS] Pollyann Savoy, MD     MDM Rules/Calculators/A&P Medical Decision Making Problems Addressed: Colitis: acute illness or injury  Amount and/or Complexity of Data Reviewed Labs: ordered. Decision-making details documented in ED Course. Radiology: ordered and independent interpretation performed. Decision-making details documented in ED Course.  Risk Prescription drug management.     Final  Clinical Impression(s) / ED Diagnoses Final diagnoses:  Colitis    Rx / DC Orders ED Discharge Orders          Ordered    ondansetron (ZOFRAN-ODT) 4 MG disintegrating tablet  Every 8 hours PRN        04/03/23 0142    amoxicillin-clavulanate (AUGMENTIN) 875-125 MG tablet  Every 12 hours        04/03/23 0142             Pollyann Savoy, MD 04/03/23 [phone]

## 2023-04-02 NOTE — ED Triage Notes (Signed)
Pt POV reports possible food poisoning after eating last night. C/o diarrhea, nausea. Denies emesis.

## 2023-04-03 ENCOUNTER — Emergency Department (HOSPITAL_BASED_OUTPATIENT_CLINIC_OR_DEPARTMENT_OTHER): Payer: 59

## 2023-04-03 ENCOUNTER — Other Ambulatory Visit: Payer: Self-pay | Admitting: Family

## 2023-04-03 DIAGNOSIS — M545 Low back pain, unspecified: Secondary | ICD-10-CM

## 2023-04-03 DIAGNOSIS — K529 Noninfective gastroenteritis and colitis, unspecified: Secondary | ICD-10-CM | POA: Diagnosis not present

## 2023-04-03 LAB — URINALYSIS, ROUTINE W REFLEX MICROSCOPIC
Bilirubin Urine: NEGATIVE
Glucose, UA: NEGATIVE mg/dL
Hgb urine dipstick: NEGATIVE
Ketones, ur: NEGATIVE mg/dL
Leukocytes,Ua: NEGATIVE
Nitrite: NEGATIVE
Protein, ur: NEGATIVE mg/dL
Specific Gravity, Urine: 1.02 (ref 1.005–1.030)
pH: 5 (ref 5.0–8.0)

## 2023-04-03 MED ORDER — ONDANSETRON 4 MG PO TBDP: 4.0000 mg | ORAL_TABLET | Freq: Three times a day (TID) | ORAL | 0 refills | Status: DC | PRN

## 2023-04-03 MED ORDER — AMOXICILLIN-POT CLAVULANATE 875-125 MG PO TABS: 1.0000 | ORAL_TABLET | Freq: Two times a day (BID) | ORAL | 0 refills | Status: AC

## 2023-04-03 MED ORDER — AMOXICILLIN-POT CLAVULANATE 875-125 MG PO TABS
1.0000 | ORAL_TABLET | Freq: Once | ORAL | Status: AC
  Administered 2023-04-03: 1 via ORAL
  Filled 2023-04-03: qty 1

## 2023-04-03 MED ORDER — IOHEXOL 300 MG/ML  SOLN
100.0000 mL | Freq: Once | INTRAMUSCULAR | Status: AC | PRN
  Administered 2023-04-03: 100 mL via INTRAVENOUS

## 2023-04-23 ENCOUNTER — Ambulatory Visit
Admission: RE | Admit: 2023-04-23 | Discharge: 2023-04-23 | Disposition: A | Discharge status: Home / Self Care | Payer: 59 | Source: Ambulatory Visit | Attending: Family | Admitting: Family

## 2023-04-23 DIAGNOSIS — M545 Low back pain, unspecified: Secondary | ICD-10-CM

## 2023-06-29 ENCOUNTER — Emergency Department (HOSPITAL_BASED_OUTPATIENT_CLINIC_OR_DEPARTMENT_OTHER)
Admission: EM | Admit: 2023-06-29 | Discharge: 2023-06-29 | Disposition: A | Discharge status: Home / Self Care | Payer: 59 | Attending: Emergency Medicine | Admitting: Emergency Medicine

## 2023-06-29 ENCOUNTER — Other Ambulatory Visit: Payer: Self-pay

## 2023-06-29 DIAGNOSIS — J069 Acute upper respiratory infection, unspecified: Secondary | ICD-10-CM | POA: Insufficient documentation

## 2023-06-29 DIAGNOSIS — Z20822 Contact with and (suspected) exposure to covid-19: Secondary | ICD-10-CM | POA: Insufficient documentation

## 2023-06-29 DIAGNOSIS — R059 Cough, unspecified: Secondary | ICD-10-CM | POA: Diagnosis present

## 2023-06-29 LAB — SARS CORONAVIRUS 2 BY RT PCR: SARS Coronavirus 2 by RT PCR: NEGATIVE

## 2023-06-29 NOTE — ED Notes (Signed)
Reviewed discharge instructions with pt. Pt states understanding

## 2023-06-29 NOTE — ED Provider Notes (Signed)
[facility] AT [facility] Provider Note   CSN: [id number] Arrival date & time: 06/29/23  1650     History  Chief Complaint  Patient presents with   Cough    Kathleen Orr is a 66 y.o. female.  67 year old female here today for runny nose, sore throat and cough that began last night.  Has not felt short of breath.  Has a history of COPD not on any home O2.   Cough      Home Medications Prior to Admission medications   Medication Sig Start Date End Date Taking? Authorizing Provider  acetaminophen (TYLENOL) 500 MG tablet Take 2 tablets (1,000 mg total) by mouth every 6 (six) hours as needed for mild pain or fever. 02/06/22   Juliet Rude, PA-C  albuterol (VENTOLIN HFA) 108 (90 Base) MCG/ACT inhaler Inhale 2 puffs into the lungs every 6 (six) hours as needed for wheezing or shortness of breath.    [provider]  amoxicillin-clavulanate (AUGMENTIN) 875-125 MG tablet Take 1 tablet by mouth every 12 (twelve) hours. 04/03/23   Pollyann Savoy, MD  docusate sodium (COLACE) 100 MG capsule Take 1 capsule (100 mg total) by mouth daily as needed for mild constipation. 02/06/22   Juliet Rude, PA-C  fluticasone (FLONASE) 50 MCG/ACT nasal spray Place 1-2 sprays into both nostrils daily as needed for allergies or rhinitis.    [provider]  fluticasone furoate-vilanterol (BREO ELLIPTA) 100-25 MCG/ACT AEPB Inhale 1 puff into the lungs at bedtime. 11/10/21   [provider]  hydrochlorothiazide (HYDRODIURIL) 25 MG tablet Take 25 mg by mouth daily. Patient not taking: Reported on 02/05/2022    [provider]  ibuprofen (ADVIL) 800 MG tablet Take 800 mg by mouth daily as needed for headache or mild pain.    [provider]  lisinopril (ZESTRIL) 20 MG tablet Take 20 mg by mouth daily. 12/08/21   [provider]  NON FORMULARY Take 1 capsule by mouth See admin instructions. OmegaXL Joint Pain Relief &  Inflammation Supplement capsules- Take 1 capsule by mouth in the morning and evening    [provider]  omeprazole (PRILOSEC) 20 MG capsule Take 1 capsule (20 mg total) by mouth 2 (two) times daily before a meal. Patient taking differently: Take 20 mg by mouth daily before breakfast. 05/21/20   Fayrene Helper, PA-C  ondansetron (ZOFRAN-ODT) 4 MG disintegrating tablet Take 1 tablet (4 mg total) by mouth every 8 (eight) hours as needed for nausea or vomiting. 04/03/23   Pollyann Savoy, MD  polyethylene glycol (MIRALAX / GLYCOLAX) 17 g packet Take 17 g by mouth daily as needed for mild constipation. 02/06/22   Juliet Rude, PA-C  tiZANidine (ZANAFLEX) 4 MG tablet Take 4 mg by mouth 3 (three) times daily as needed for muscle spasms. 12/14/21   [provider]      Allergies    Gabapentin    Review of Systems   Review of Systems  Respiratory:  Positive for cough.     Physical Exam Updated Vital Signs BP 131/88 (BP Location: Left Arm)   Pulse 87   Temp 98 F (36.7 C) (Oral)   Resp 18   Ht 5\' 5"  (1.651 m)   Wt 88.5 kg   SpO2 95%   BMI 32.45 kg/m  Physical Exam Vitals reviewed.  Constitutional:      General: She is not in acute distress. HENT:     Mouth/Throat:  Mouth: Mucous membranes are moist.     Pharynx: Oropharynx is clear. Posterior oropharyngeal erythema present. No oropharyngeal exudate.  Pulmonary:     Effort: Pulmonary effort is normal. No respiratory distress.     Breath sounds: No stridor. No wheezing, rhonchi or rales.  Neurological:     General: No focal deficit present.     Mental Status: She is alert.     ED Results / Procedures / Treatments   Labs (all labs ordered are listed, but only abnormal results are displayed) Labs Reviewed  SARS CORONAVIRUS 2 BY RT PCR    EKG None  Radiology No results found.  Procedures Procedures    Medications Ordered in ED Medications - No data to display  ED Course/ Medical Decision Making/  A&P                                 Medical Decision Making This is a [age]-year-old female is here today for runny nose sore throat cough that began yesterday.  Differential diagnoses include viral syndrome, less likely strep pharyngitis, less likely pneumonia.  Plan # patient symptoms are consistent with viral syndrome.  Patient has normal vital signs.  No indication for labs or imaging at this time.  Patient has a history of COPD, 95% is her baseline.  Will discharge home.           Final Clinical Impression(s) / ED Diagnoses Final diagnoses:  Viral URI with cough    Rx / DC Orders ED Discharge Orders     None         Arletha Pili, DO 06/29/23 [name]

## 2023-06-29 NOTE — Discharge Instructions (Addendum)
You can continue taking all of your medications as prescribed.  You may use over-the-counter medications for symptomatic relief.  Follow-up with your primary care doctor this week.  Your symptoms will last 3 to 7 days.

## 2023-06-29 NOTE — ED Triage Notes (Signed)
Patient presents to ED via POV from home. Here with cough, body aches and runny nose since last night.

## 2023-08-05 ENCOUNTER — Ambulatory Visit: Payer: 59

## 2023-08-05 ENCOUNTER — Ambulatory Visit
Admission: RE | Admit: 2023-08-05 | Discharge: 2023-08-05 | Disposition: A | Discharge status: Home / Self Care | Payer: 59 | Source: Ambulatory Visit | Attending: Family Medicine | Admitting: Family Medicine

## 2023-08-05 VITALS — BP 193/96 | HR 63 | Temp 97.9°F | Resp 16

## 2023-08-05 DIAGNOSIS — J441 Chronic obstructive pulmonary disease with (acute) exacerbation: Secondary | ICD-10-CM | POA: Diagnosis not present

## 2023-08-05 DIAGNOSIS — R059 Cough, unspecified: Secondary | ICD-10-CM | POA: Diagnosis not present

## 2023-08-05 MED ORDER — IPRATROPIUM-ALBUTEROL 0.5-2.5 (3) MG/3ML IN SOLN
3.0000 mL | Freq: Once | RESPIRATORY_TRACT | Status: AC
  Administered 2023-08-05: 3 mL via RESPIRATORY_TRACT

## 2023-08-05 MED ORDER — DOXYCYCLINE HYCLATE 100 MG PO CAPS: 100.0000 mg | ORAL_CAPSULE | Freq: Two times a day (BID) | ORAL | 0 refills | Status: AC

## 2023-08-05 MED ORDER — PREDNISONE 20 MG PO TABS: 40.0000 mg | ORAL_TABLET | Freq: Every day | ORAL | 0 refills | Status: AC

## 2023-08-05 NOTE — ED Provider Notes (Signed)
[facility]    CSN: [id number] Arrival date & time: 08/05/23  1512      History   Chief Complaint Chief Complaint  Patient presents with   Influenza    Entered by patient    HPI Kathleen Orr is a 67 y.o. female.    Influenza Presenting symptoms: cough, fatigue, fever, rhinorrhea, shortness of breath and sore throat   Associated symptoms: chills and nasal congestion    Patient is here for coughing, sore throat, sinus congestion.  Some difficulty breathing.   Some chills now.  She did have a fever initially.  Symptoms have been going on 6 days.  No n/v.  She has been using tylenol, sinus medications.  She does have copd.  She uses her inhalers regularly.        Past Medical History:  Diagnosis Date   Arthritis    Asthma    COPD (chronic obstructive pulmonary disease) (HCC)    Diabetes mellitus without complication (HCC)    Hepatitis C test positive    Hypertension     Patient Active Problem List   Diagnosis Date Noted   Cholelithiasis with cholecystitis 02/05/2022    Past Surgical History:  Procedure Laterality Date   ABDOMINAL HYSTERECTOMY     CHOLECYSTECTOMY N/A 02/05/2022   Procedure: LAPAROSCOPIC CHOLECYSTECTOMY WITH INTRAOPERATIVE CHOLANGIOGRAM;  Surgeon: Darnell Level, MD;  Location: [facility];  Service: General;  Laterality: N/A;   TUBAL LIGATION      OB History   No obstetric history on file.      Home Medications    Prior to Admission medications   Medication Sig Start Date End Date Taking? Authorizing Provider  acetaminophen (TYLENOL) 500 MG tablet Take 2 tablets (1,000 mg total) by mouth every 6 (six) hours as needed for mild pain or fever. 02/06/22   Juliet Rude, PA-C  albuterol (VENTOLIN HFA) 108 (90 Base) MCG/ACT inhaler Inhale 2 puffs into the lungs every 6 (six) hours as needed for wheezing or shortness of breath.    [provider]  amoxicillin-clavulanate (AUGMENTIN) 875-125 MG tablet Take 1 tablet by mouth  every 12 (twelve) hours. 04/03/23   Pollyann Savoy, MD  docusate sodium (COLACE) 100 MG capsule Take 1 capsule (100 mg total) by mouth daily as needed for mild constipation. 02/06/22   Juliet Rude, PA-C  fluticasone (FLONASE) 50 MCG/ACT nasal spray Place 1-2 sprays into both nostrils daily as needed for allergies or rhinitis.    [provider]  fluticasone furoate-vilanterol (BREO ELLIPTA) 100-25 MCG/ACT AEPB Inhale 1 puff into the lungs at bedtime. 11/10/21   [provider]  hydrochlorothiazide (HYDRODIURIL) 25 MG tablet Take 25 mg by mouth daily. Patient not taking: Reported on 02/05/2022    [provider]  ibuprofen (ADVIL) 800 MG tablet Take 800 mg by mouth daily as needed for headache or mild pain.    [provider]  lisinopril (ZESTRIL) 20 MG tablet Take 20 mg by mouth daily. 12/08/21   [provider]  NON FORMULARY Take 1 capsule by mouth See admin instructions. OmegaXL Joint Pain Relief & Inflammation Supplement capsules- Take 1 capsule by mouth in the morning and evening    [provider]  omeprazole (PRILOSEC) 20 MG capsule Take 1 capsule (20 mg total) by mouth 2 (two) times daily before a meal. Patient taking differently: Take 20 mg by mouth daily before breakfast. 05/21/20   Fayrene Helper, PA-C  ondansetron (ZOFRAN-ODT) 4 MG disintegrating tablet Take 1  tablet (4 mg total) by mouth every 8 (eight) hours as needed for nausea or vomiting. 04/03/23   Pollyann Savoy, MD  polyethylene glycol (MIRALAX / GLYCOLAX) 17 g packet Take 17 g by mouth daily as needed for mild constipation. 02/06/22   Juliet Rude, PA-C  tiZANidine (ZANAFLEX) 4 MG tablet Take 4 mg by mouth 3 (three) times daily as needed for muscle spasms. 12/14/21   [provider]    Family History Family History  Problem Relation Age of Onset   Heart failure Mother    Hypertension Mother    CAD Mother    Cancer Father    COPD Sister     Social  History Social History   Tobacco Use   Smoking status: Former    Types: Cigarettes   Smokeless tobacco: Never  Vaping Use   Vaping status: Never Used  Substance Use Topics   Alcohol use: Never   Drug use: Not Currently     Allergies   Gabapentin   Review of Systems Review of Systems  Constitutional:  Positive for chills, fatigue and fever.  HENT:  Positive for congestion, rhinorrhea and sore throat.   Respiratory:  Positive for cough, shortness of breath and wheezing.   Cardiovascular: Negative.   Gastrointestinal: Negative.   Musculoskeletal: Negative.   Psychiatric/Behavioral: Negative.       Physical Exam Triage Vital Signs ED Triage Vitals  Encounter Vitals Group     BP 08/05/23 1544 (!) 193/96     Systolic BP Percentile --      Diastolic BP Percentile --      Pulse Rate 08/05/23 1544 63     Resp 08/05/23 1544 16     Temp 08/05/23 1544 97.9 F (36.6 C)     Temp Source 08/05/23 1544 Oral     SpO2 08/05/23 1544 95 %     Weight --      Height --      Head Circumference --      Peak Flow --      Pain Score 08/05/23 1546 6     Pain Loc --      Pain Education --      Exclude from Growth Chart --    No data found.  Updated Vital Signs BP (!) 193/96 (BP Location: Right Arm)   Pulse 63   Temp 97.9 F (36.6 C) (Oral)   Resp 16   SpO2 95%   Visual Acuity Right Eye Distance:   Left Eye Distance:   Bilateral Distance:    Right Eye Near:   Left Eye Near:    Bilateral Near:     Physical Exam Constitutional:      Appearance: Normal appearance. She is ill-appearing.  HENT:     Nose: Congestion and rhinorrhea present.     Mouth/Throat:     Mouth: Mucous membranes are moist.     Pharynx: No oropharyngeal exudate or posterior oropharyngeal erythema.  Cardiovascular:     Rate and Rhythm: Normal rate and regular rhythm.  Pulmonary:     Effort: Pulmonary effort is normal.     Breath sounds: Wheezing present.  Musculoskeletal:     Cervical back:  Normal range of motion and neck supple. No tenderness.  Skin:    General: Skin is warm.  Neurological:     General: No focal deficit present.     Mental Status: She is alert.  Psychiatric:        Mood and Affect:  Mood normal.      UC Treatments / Results  Labs (all labs ordered are listed, but only abnormal results are displayed) Labs Reviewed - No data to display  EKG   Radiology No results found.  Procedures Procedures (including critical care time)  Medications Ordered in UC Medications  ipratropium-albuterol (DUONEB) 0.5-2.5 (3) MG/3ML nebulizer solution 3 mL (has no administration in time range)    Initial Impression / Assessment and Plan / UC Course  I have reviewed the triage vital signs and the nursing notes.  Pertinent labs & imaging results that were available during my care of the patient were reviewed by me and considered in my medical decision making (see chart for details).  Final Clinical Impressions(s) / UC Diagnoses   Final diagnoses:  COPD exacerbation (HCC)     Discharge Instructions      You were diagnosed with a copd exacerbation today.  You were given a breathing treatment to help.  Your xray appears normal today, but if the radiologist sees something on the xray we will notify you.  I have sent out an antibiotic and steroid to help your symptoms.  Please return if you are not improving or are worsening despite medication.     ED Prescriptions     Medication Sig Dispense Auth. Provider   doxycycline (VIBRAMYCIN) 100 MG capsule Take 1 capsule (100 mg total) by mouth 2 (two) times daily. 20 capsule Nazire Fruth, MD   predniSONE (DELTASONE) 20 MG tablet Take 2 tablets (40 mg total) by mouth daily for 5 days. 10 tablet Jannifer Franklin, MD      PDMP not reviewed this encounter.   Jannifer Franklin, MD 08/05/23 1616

## 2023-08-05 NOTE — ED Triage Notes (Signed)
Cough, runny nose, body aches since Wednesday last week.

## 2023-08-05 NOTE — Discharge Instructions (Addendum)
You were diagnosed with a copd exacerbation today.  You were given a breathing treatment to help.  Your xray appears normal today, but if the radiologist sees something on the xray we will notify you.  I have sent out an antibiotic and steroid to help your symptoms.  Please return if you are not improving or are worsening despite medication.

## 2023-09-16 ENCOUNTER — Encounter (HOSPITAL_BASED_OUTPATIENT_CLINIC_OR_DEPARTMENT_OTHER): Payer: Self-pay | Admitting: Emergency Medicine

## 2023-09-16 ENCOUNTER — Other Ambulatory Visit: Payer: Self-pay

## 2023-09-16 ENCOUNTER — Emergency Department (HOSPITAL_BASED_OUTPATIENT_CLINIC_OR_DEPARTMENT_OTHER)
Admission: EM | Admit: 2023-09-16 | Discharge: 2023-09-17 | Disposition: A | Discharge status: Home / Self Care | Payer: 59 | Attending: Emergency Medicine | Admitting: Emergency Medicine

## 2023-09-16 DIAGNOSIS — J449 Chronic obstructive pulmonary disease, unspecified: Secondary | ICD-10-CM | POA: Insufficient documentation

## 2023-09-16 DIAGNOSIS — E119 Type 2 diabetes mellitus without complications: Secondary | ICD-10-CM | POA: Diagnosis not present

## 2023-09-16 DIAGNOSIS — J45909 Unspecified asthma, uncomplicated: Secondary | ICD-10-CM | POA: Insufficient documentation

## 2023-09-16 DIAGNOSIS — R1084 Generalized abdominal pain: Secondary | ICD-10-CM | POA: Diagnosis present

## 2023-09-16 DIAGNOSIS — Z87891 Personal history of nicotine dependence: Secondary | ICD-10-CM | POA: Diagnosis not present

## 2023-09-16 DIAGNOSIS — K529 Noninfective gastroenteritis and colitis, unspecified: Secondary | ICD-10-CM | POA: Diagnosis not present

## 2023-09-16 DIAGNOSIS — I10 Essential (primary) hypertension: Secondary | ICD-10-CM | POA: Insufficient documentation

## 2023-09-16 LAB — HEPATIC FUNCTION PANEL
ALT: 19 U/L (ref 0–44)
AST: 21 U/L (ref 15–41)
Albumin: 4.1 g/dL (ref 3.5–5.0)
Alkaline Phosphatase: 59 U/L (ref 38–126)
Bilirubin, Direct: 0.1 mg/dL (ref 0.0–0.2)
Indirect Bilirubin: 0.7 mg/dL (ref 0.3–0.9)
Total Bilirubin: 0.8 mg/dL (ref <1.2)
Total Protein: 7.4 g/dL (ref 6.5–8.1)

## 2023-09-16 LAB — I-STAT CHEM 8, ED
BUN: 16 mg/dL (ref 8–23)
Calcium, Ion: 1.12 mmol/L — ABNORMAL LOW (ref 1.15–1.40)
Chloride: 104 mmol/L (ref 98–111)
Creatinine, Ser: 0.9 mg/dL (ref 0.44–1.00)
Glucose, Bld: 111 mg/dL — ABNORMAL HIGH (ref 70–99)
HCT: 41 % (ref 36.0–46.0)
Hemoglobin: 13.9 g/dL (ref 12.0–15.0)
Potassium: 3.2 mmol/L — ABNORMAL LOW (ref 3.5–5.1)
Sodium: 140 mmol/L (ref 135–145)
TCO2: 24 mmol/L (ref 22–32)

## 2023-09-16 LAB — CBC
HCT: 40.6 % (ref 36.0–46.0)
Hemoglobin: 13.4 g/dL (ref 12.0–15.0)
MCH: 28.5 pg (ref 26.0–34.0)
MCHC: 33 g/dL (ref 30.0–36.0)
MCV: 86.4 fL (ref 80.0–100.0)
Platelets: 175 10*3/uL (ref 150–400)
RBC: 4.7 MIL/uL (ref 3.87–5.11)
RDW: 13.2 % (ref 11.5–15.5)
WBC: 6.9 10*3/uL (ref 4.0–10.5)
nRBC: 0 % (ref 0.0–0.2)

## 2023-09-16 LAB — LIPASE, BLOOD: Lipase: 32 U/L (ref 11–51)

## 2023-09-16 MED ORDER — FENTANYL CITRATE PF 50 MCG/ML IJ SOSY
25.0000 ug | PREFILLED_SYRINGE | Freq: Once | INTRAMUSCULAR | Status: AC
  Administered 2023-09-16: 25 ug via INTRAVENOUS
  Filled 2023-09-16: qty 1

## 2023-09-16 MED ORDER — KETOROLAC TROMETHAMINE 15 MG/ML IJ SOLN
15.0000 mg | Freq: Once | INTRAMUSCULAR | Status: AC
  Administered 2023-09-16: 15 mg via INTRAVENOUS
  Filled 2023-09-16: qty 1

## 2023-09-16 MED ORDER — ONDANSETRON HCL 4 MG/2ML IJ SOLN
4.0000 mg | Freq: Once | INTRAMUSCULAR | Status: AC
  Administered 2023-09-16: 4 mg via INTRAVENOUS
  Filled 2023-09-16: qty 2

## 2023-09-16 MED ORDER — SODIUM CHLORIDE 0.9 % IV BOLUS
1000.0000 mL | Freq: Once | INTRAVENOUS | Status: AC
  Administered 2023-09-16: 1000 mL via INTRAVENOUS

## 2023-09-16 NOTE — ED Provider Notes (Signed)
[facility] Emergency Department Provider Note MRN:  [id number]  Arrival date & time: 09/17/23     Chief Complaint   Abdominal Pain and Nausea   History of Present Illness   Kathleen Orr is a 67 y.o. year-old female with a history of hypertension, diabetes presenting to the ED with chief complaint of abdominal pain.  Nausea vomiting diarrhea all day today, started mildly yesterday.  Thinks maybe it was because of the tea that she drank.  She made homemade tea the day before and it was left out at room temperature for 24 hours and then she drank it.  Looked like it had some foam or sediment in it.  Upper abdominal pain as well.  Denies fever.  Feels very cold.  Review of Systems  A thorough review of systems was obtained and all systems are negative except as noted in the HPI and PMH.   Patient's Health History    Past Medical History:  Diagnosis Date   Arthritis    Asthma    COPD (chronic obstructive pulmonary disease) (HCC)    Diabetes mellitus without complication (HCC)    Hepatitis C test positive    Hypertension     Past Surgical History:  Procedure Laterality Date   ABDOMINAL HYSTERECTOMY     CHOLECYSTECTOMY N/A 02/05/2022   Procedure: LAPAROSCOPIC CHOLECYSTECTOMY WITH INTRAOPERATIVE CHOLANGIOGRAM;  Surgeon: Darnell Level, MD;  Location: [facility];  Service: General;  Laterality: N/A;   TUBAL LIGATION      Family History  Problem Relation Age of Onset   Heart failure Mother    Hypertension Mother    CAD Mother    Cancer Father    COPD Sister     Social History   Socioeconomic History   Marital status: Divorced    Spouse name: Not on file   Number of children: Not on file   Years of education: Not on file   Highest education level: Not on file  Occupational History   Not on file  Tobacco Use   Smoking status: Former    Types: Cigarettes   Smokeless tobacco: Never  Vaping Use   Vaping status: Never Used  Substance and Sexual  Activity   Alcohol use: Never   Drug use: Not Currently   Sexual activity: Not on file  Other Topics Concern   Not on file  Social History Narrative   Not on file   Social Determinants of Health   Financial Resource Strain: Not on file  Food Insecurity: Not on file  Transportation Needs: Not on file  Physical Activity: Not on file  Stress: Not on file  Social Connections: Not on file  Intimate Partner Violence: Not on file     Physical Exam   Vitals:   09/16/23 2345 09/17/23 0330  BP: (!) 149/78 126/74  Pulse: 89 81  Resp: 18 18  Temp:    SpO2: 94% 92%    CONSTITUTIONAL: Well-appearing, NAD NEURO/PSYCH:  Alert and oriented x 3, no focal deficits EYES:  eyes equal and reactive ENT/NECK:  no LAD, no JVD CARDIO: Regular rate, well-perfused, normal S1 and S2 PULM:  CTAB no wheezing or rhonchi GI/GU:  non-distended, mild epigastric tenderness to palpation MSK/SPINE:  No gross deformities, no edema SKIN:  no rash, atraumatic   *Additional and/or pertinent findings included in MDM below  Diagnostic and Interventional Summary    EKG Interpretation Date/Time:    Ventricular Rate:    PR Interval:  QRS Duration:    QT Interval:    QTC Calculation:   R Axis:      Text Interpretation:         Labs Reviewed  URINALYSIS, ROUTINE W REFLEX MICROSCOPIC - Abnormal; Notable for the following components:      Result Value   APPearance HAZY (*)    Hgb urine dipstick TRACE (*)    All other components within normal limits  URINALYSIS, MICROSCOPIC (REFLEX) - Abnormal; Notable for the following components:   Bacteria, UA RARE (*)    All other components within normal limits  I-STAT CHEM 8, ED - Abnormal; Notable for the following components:   Potassium 3.2 (*)    Glucose, Bld 111 (*)    Calcium, Ion 1.12 (*)    All other components within normal limits  LIPASE, BLOOD  CBC  HEPATIC FUNCTION PANEL    CT ABDOMEN PELVIS W CONTRAST  Final Result      Medications   sodium chloride 0.9 % bolus 1,000 mL (1,000 mLs Intravenous New Bag/Given 09/16/23 2328)  ondansetron (ZOFRAN) injection 4 mg (4 mg Intravenous Given 09/16/23 2328)  ketorolac (TORADOL) 15 MG/ML injection 15 mg (15 mg Intravenous Given 09/16/23 2329)  fentaNYL (SUBLIMAZE) injection 25 mcg (25 mcg Intravenous Given 09/16/23 2330)  iohexol (OMNIPAQUE) 300 MG/ML solution 100 mL (100 mLs Intravenous Contrast Given 09/17/23 0019)     Procedures  /  Critical Care Procedures  ED Course and Medical Decision Making  Initial Impression and Ddx History is most suggestive of a gastroenteritis.  Does have some mild tenderness to palpation, will attempt symptomatic management and reassess.  Triage labs overall reassuring with no leukocytosis, still awaiting LFTs.  Past medical/surgical history that increases complexity of ED encounter: None  Interpretation of Diagnostics I personally reviewed the laboratory assessment and my interpretation is as follows: No significant blood count or electrolyte disturbance  CT with evidence to suggest mild colitis, no emergent process  Patient Reassessment and Ultimate Disposition/Management     Patient doing much better on reassessment, resting comfortably, symptoms largely resolved, appropriate for discharge.  Patient management required discussion with the following services or consulting groups:  None  Complexity of Problems Addressed Acute illness or injury that poses threat of life of bodily function  Additional Data Reviewed and Analyzed Further history obtained from: Prior labs/imaging results  Additional Factors Impacting ED Encounter Risk Prescriptions and Use of parenteral controlled substances  Elmer Sow. Pilar Plate, MD [facility] Emergency Medicine [facility] [email or web address]  Final Clinical Impressions(s) / ED Diagnoses     ICD-10-CM   1. Generalized abdominal pain  R10.84     2. Colitis  K52.9       ED Discharge Orders           Ordered    ondansetron (ZOFRAN-ODT) 4 MG disintegrating tablet  Every 8 hours PRN        09/17/23 0345    loperamide (IMODIUM) 2 MG capsule  4 times daily PRN        09/17/23 0345             Discharge Instructions Discussed with and Provided to Patient:     Discharge Instructions      You were evaluated in the Emergency Department and after careful evaluation, we did not find any emergent condition requiring admission or further testing in the hospital.  Your exam/testing today is overall reassuring.  Symptoms seem to be due to a stomach bug.  Take the Zofran as needed for nausea, take the Imodium as needed for diarrhea.  Can use Tylenol or Motrin at home for pain.  Plenty of fluids and rest.  Please return to the Emergency Department if you experience any worsening of your condition.   Thank you for allowing [place] to be a part of your care.       Sabas Sous, MD 09/17/23 [phone]

## 2023-09-16 NOTE — ED Triage Notes (Signed)
Pt reports she thinks she may have food poisoning. Reports N/V/D after drinking bad tea. Reports she had food poisoning before & this feels the same.

## 2023-09-17 ENCOUNTER — Emergency Department (HOSPITAL_BASED_OUTPATIENT_CLINIC_OR_DEPARTMENT_OTHER): Payer: 59

## 2023-09-17 LAB — URINALYSIS, MICROSCOPIC (REFLEX)

## 2023-09-17 LAB — URINALYSIS, ROUTINE W REFLEX MICROSCOPIC
Bilirubin Urine: NEGATIVE
Glucose, UA: NEGATIVE mg/dL
Ketones, ur: NEGATIVE mg/dL
Leukocytes,Ua: NEGATIVE
Nitrite: NEGATIVE
Protein, ur: NEGATIVE mg/dL
Specific Gravity, Urine: >=1.03 (ref 1.005–1.030)
pH: 5.5 (ref 5.0–8.0)

## 2023-09-17 MED ORDER — ONDANSETRON 4 MG PO TBDP: 4.0000 mg | ORAL_TABLET | Freq: Three times a day (TID) | ORAL | 0 refills | Status: AC | PRN

## 2023-09-17 MED ORDER — IOHEXOL 300 MG/ML  SOLN
100.0000 mL | Freq: Once | INTRAMUSCULAR | Status: AC | PRN
Start: 2023-09-17 — End: 2023-09-17
  Administered 2023-09-17: 100 mL via INTRAVENOUS

## 2023-09-17 MED ORDER — LOPERAMIDE HCL 2 MG PO CAPS: 2.0000 mg | ORAL_CAPSULE | Freq: Four times a day (QID) | ORAL | 0 refills | Status: AC | PRN

## 2023-09-17 NOTE — Discharge Instructions (Addendum)
You were evaluated in the Emergency Department and after careful evaluation, we did not find any emergent condition requiring admission or further testing in the hospital.  Your exam/testing today is overall reassuring.  Symptoms seem to be due to a stomach bug.  Take the Zofran as needed for nausea, take the Imodium as needed for diarrhea.  Can use Tylenol or Motrin at home for pain.  Plenty of fluids and rest.  Please return to the Emergency Department if you experience any worsening of your condition.   Thank you for allowing [place] to be a part of your care.

## 2023-09-17 NOTE — ED Notes (Signed)
Patient resting, eyes closed. Respirations even and unlabored. 

## 2023-09-26 ENCOUNTER — Emergency Department (HOSPITAL_BASED_OUTPATIENT_CLINIC_OR_DEPARTMENT_OTHER): Payer: 59

## 2023-09-26 ENCOUNTER — Other Ambulatory Visit: Payer: Self-pay

## 2023-09-26 ENCOUNTER — Encounter (HOSPITAL_BASED_OUTPATIENT_CLINIC_OR_DEPARTMENT_OTHER): Payer: Self-pay

## 2023-09-26 DIAGNOSIS — Y9301 Activity, walking, marching and hiking: Secondary | ICD-10-CM | POA: Insufficient documentation

## 2023-09-26 DIAGNOSIS — W108XXA Fall (on) (from) other stairs and steps, initial encounter: Secondary | ICD-10-CM | POA: Diagnosis not present

## 2023-09-26 DIAGNOSIS — E119 Type 2 diabetes mellitus without complications: Secondary | ICD-10-CM | POA: Insufficient documentation

## 2023-09-26 DIAGNOSIS — S8991XA Unspecified injury of right lower leg, initial encounter: Secondary | ICD-10-CM | POA: Diagnosis present

## 2023-09-26 DIAGNOSIS — Z79899 Other long term (current) drug therapy: Secondary | ICD-10-CM | POA: Diagnosis not present

## 2023-09-26 DIAGNOSIS — M7989 Other specified soft tissue disorders: Secondary | ICD-10-CM | POA: Diagnosis not present

## 2023-09-26 DIAGNOSIS — S8001XA Contusion of right knee, initial encounter: Secondary | ICD-10-CM | POA: Insufficient documentation

## 2023-09-26 DIAGNOSIS — I10 Essential (primary) hypertension: Secondary | ICD-10-CM | POA: Insufficient documentation

## 2023-09-26 NOTE — ED Triage Notes (Signed)
Pt arrives with c/o right injury after a fall today. Pt report falling after missing a step and falling on her knee. Pt has swelling to knee.

## 2023-09-27 ENCOUNTER — Emergency Department (HOSPITAL_BASED_OUTPATIENT_CLINIC_OR_DEPARTMENT_OTHER): Payer: 59

## 2023-09-27 ENCOUNTER — Emergency Department (HOSPITAL_BASED_OUTPATIENT_CLINIC_OR_DEPARTMENT_OTHER)
Admission: EM | Admit: 2023-09-27 | Discharge: 2023-09-27 | Disposition: A | Discharge status: Home / Self Care | Payer: 59 | Attending: Emergency Medicine | Admitting: Emergency Medicine

## 2023-09-27 DIAGNOSIS — S8001XA Contusion of right knee, initial encounter: Secondary | ICD-10-CM

## 2023-09-27 MED ORDER — DICLOFENAC SODIUM 1 % EX GEL: 4.0000 g | Freq: Four times a day (QID) | CUTANEOUS | 0 refills | Status: AC

## 2023-09-27 MED ORDER — DICLOFENAC SODIUM 1 % EX GEL
4.0000 g | Freq: Once | CUTANEOUS | Status: AC
  Administered 2023-09-27: 4 g via TOPICAL
  Filled 2023-09-27: qty 100

## 2023-09-27 MED ORDER — HYDROCODONE-ACETAMINOPHEN 5-325 MG PO TABS
1.0000 | ORAL_TABLET | Freq: Once | ORAL | Status: AC
  Administered 2023-09-27: 1 via ORAL
  Filled 2023-09-27: qty 1

## 2023-09-27 NOTE — ED Provider Notes (Signed)
[facility] Provider Note   CSN: [id number] Arrival date & time: 09/26/23  2215     History  Chief Complaint  Patient presents with   Knee Injury    Kathleen Orr is a 67 y.o. female.  The history is provided by the patient.  Kathleen Orr is a 68 y.o. female who presents to the Emergency Department complaining of knee injury.  She presents to the emergency department for evaluation of injury to her right knee that started when she was walking up stairs and missed a step and fell directly on it around 8 PM.  She landed on a concrete landing.  Initially she could get back up and walk around but later her pain and swelling increased and is now difficult to bear weight.   No head injury.   Hx/o HTN, DM.  Lives with grandson in a two story house.  Walks independently.     Home Medications Prior to Admission medications   Medication Sig Start Date End Date Taking? Authorizing Provider  diclofenac Sodium (VOLTAREN) 1 % GEL Apply 4 g topically 4 (four) times daily. 09/27/23  Yes Tilden Fossa, MD  acetaminophen (TYLENOL) 500 MG tablet Take 2 tablets (1,000 mg total) by mouth every 6 (six) hours as needed for mild pain or fever. 02/06/22   Juliet Rude, PA-C  albuterol (VENTOLIN HFA) 108 (90 Base) MCG/ACT inhaler Inhale 2 puffs into the lungs every 6 (six) hours as needed for wheezing or shortness of breath.    [provider]  amoxicillin-clavulanate (AUGMENTIN) 875-125 MG tablet Take 1 tablet by mouth every 12 (twelve) hours. 04/03/23   Pollyann Savoy, MD  docusate sodium (COLACE) 100 MG capsule Take 1 capsule (100 mg total) by mouth daily as needed for mild constipation. 02/06/22   Juliet Rude, PA-C  doxycycline (VIBRAMYCIN) 100 MG capsule Take 1 capsule (100 mg total) by mouth 2 (two) times daily. 08/05/23   Piontek, Denny Peon, MD  fluticasone (FLONASE) 50 MCG/ACT nasal spray Place 1-2 sprays into both nostrils daily as needed  for allergies or rhinitis.    [provider]  fluticasone furoate-vilanterol (BREO ELLIPTA) 100-25 MCG/ACT AEPB Inhale 1 puff into the lungs at bedtime. 11/10/21   [provider]  hydrochlorothiazide (HYDRODIURIL) 25 MG tablet Take 25 mg by mouth daily. Patient not taking: Reported on 02/05/2022    [provider]  ibuprofen (ADVIL) 800 MG tablet Take 800 mg by mouth daily as needed for headache or mild pain.    [provider]  lisinopril (ZESTRIL) 20 MG tablet Take 20 mg by mouth daily. 12/08/21   [provider]  loperamide (IMODIUM) 2 MG capsule Take 1 capsule (2 mg total) by mouth 4 (four) times daily as needed for diarrhea or loose stools. 09/17/23   Sabas Sous, MD  NON FORMULARY Take 1 capsule by mouth See admin instructions. OmegaXL Joint Pain Relief & Inflammation Supplement capsules- Take 1 capsule by mouth in the morning and evening    [provider]  omeprazole (PRILOSEC) 20 MG capsule Take 1 capsule (20 mg total) by mouth 2 (two) times daily before a meal. Patient taking differently: Take 20 mg by mouth daily before breakfast. 05/21/20   Fayrene Helper, PA-C  ondansetron (ZOFRAN-ODT) 4 MG disintegrating tablet Take 1 tablet (4 mg total) by mouth every 8 (eight) hours as needed for nausea or vomiting. 09/17/23   Sabas Sous, MD  polyethylene glycol [facility] /  GLYCOLAX) 17 g packet Take 17 g by mouth daily as needed for mild constipation. 02/06/22   Juliet Rude, PA-C  tiZANidine (ZANAFLEX) 4 MG tablet Take 4 mg by mouth 3 (three) times daily as needed for muscle spasms. 12/14/21   [provider]      Allergies    Gabapentin    Review of Systems   Review of Systems  All other systems reviewed and are negative.   Physical Exam Updated Vital Signs BP (!) 144/88   Pulse 65   Temp 98.4 F (36.9 C)   Resp 18   Wt 86.2 kg   SpO2 98%   BMI 31.62 kg/m  Physical Exam Vitals and nursing note reviewed.   Constitutional:      Appearance: She is well-developed.  HENT:     Head: Normocephalic and atraumatic.  Cardiovascular:     Rate and Rhythm: Normal rate and regular rhythm.  Pulmonary:     Effort: Pulmonary effort is normal. No respiratory distress.  Musculoskeletal:     Comments: There is tenderness to palpation throughout the right knee, predominantly over the medial aspect.  She is able to flex and extend the knee but does have pain with range of motion.  2+ DP pulses.  No tenderness over the hip.  Skin:    General: Skin is warm and dry.  Neurological:     Mental Status: She is alert and oriented to person, place, and time.  Psychiatric:        Behavior: Behavior normal.     ED Results / Procedures / Treatments   Labs (all labs ordered are listed, but only abnormal results are displayed) Labs Reviewed - No data to display  EKG None  Radiology CT Knee Right Wo Contrast Result Date: 09/27/2023 CLINICAL DATA:  Right knee trauma would negative x-rays but occult fracture suspected. EXAM: CT OF THE RIGHT KNEE WITHOUT CONTRAST TECHNIQUE: Multidetector CT imaging of the right knee was performed according to the standard protocol. Multiplanar CT image reconstructions were also generated. RADIATION DOSE REDUCTION: This exam was performed according to the departmental dose-optimization program which includes automated exposure control, adjustment of the mA and/or kV according to patient size and/or use of iterative reconstruction technique. COMPARISON:  Right knee series from yesterday. FINDINGS: Bones/Joint/Cartilage There is osteopenia without evidence of dislocation or displaced fractures. No primary pathologic process is seen apart from the osteopenia. There is tricompartmental degenerative arthrosis, consisting of moderate narrowing in the medial femorotibial compartment, mild narrowing in the lateral patellofemoral and lateral femorotibial compartments, and mild marginal  tricompartmental spurring changes. There are no bulky osteophytes. There is trace spurring of the tibial spines as well. There is a patellar enthesophyte superiorly at the quadriceps insertion. There is mild-to-moderate joint fluid, most of which is suprapatellar. The fluid is low in density ranging 14-18 Hounsfield units and does not appear consistent with hemarthrosis. There is no air in the joint space. Additionally noted is a small popliteal cyst, which appears uncomplicated. The left knee was also imaged but only axially. There are similar degenerative findings on the left but there is no left knee joint effusion. Ligaments Suboptimally assessed by CT. Muscles and Tendons No acute noncontrast CT findings. Soft tissues There are calcific plaques in the popliteal and anterior tibial arteries. No hematoma is seen. No soft tissue mass. IMPRESSION: 1. Osteopenia without evidence of dislocation or displaced fractures. 2. Tricompartmental arthrosis, most prominent in the medial femorotibial compartment. 3. Mild-to-moderate joint fluid, most of  which is suprapatellar. The fluid is low in density and does not appear consistent with hemarthrosis. 4. Small popliteal cyst. 5. Atherosclerosis. Electronically Signed   By: Almira Bar M.D.   On: 09/27/2023 04:28   DG Knee Complete 4 Views Right Result Date: 09/26/2023 CLINICAL DATA:  Recent fall on stairs with right knee pain, initial encounter EXAM: RIGHT KNEE - COMPLETE 4+ VIEW COMPARISON:  None Available. FINDINGS: Mild degenerative changes are noted in the medial and lateral joint spaces. No acute fracture or dislocation is seen. No soft tissue abnormality is noted. IMPRESSION: Mild degenerative change without acute abnormality. Electronically Signed   By: Alcide Clever M.D.   On: 09/26/2023 23:14    Procedures Procedures    Medications Ordered in ED Medications  diclofenac Sodium (VOLTAREN) 1 % topical gel 4 g (4 g Topical Given 09/27/23 0302)   HYDROcodone-acetaminophen (NORCO/VICODIN) 5-325 MG per tablet 1 tablet (1 tablet Oral Given 09/27/23 0301)    ED Course/ Medical Decision Making/ A&P                                 Medical Decision Making Amount and/or Complexity of Data Reviewed Radiology: ordered.  Risk Prescription drug management.   Patient here for evaluation of injuries following a mechanical fall.  She does have significant tenderness on examination.  Plain film is negative for acute fracture but does demonstrate some degenerative changes.  Given degree of pain a CT scan was obtained, which is negative for fracture but does demonstrate evidence of arthritis, suprapatellar effusion.  Will place in knee immobilizer for comfort.  Will prescribe Voltaren gel.  Plan to discharge with outpatient PCP/orthopedics follow-up and return precautions.        Final Clinical Impression(s) / ED Diagnoses Final diagnoses:  Contusion of right knee, initial encounter    Rx / DC Orders ED Discharge Orders          Ordered    diclofenac Sodium (VOLTAREN) 1 % GEL  4 times daily        09/27/23 0438              Tilden Fossa, MD 09/27/23 [phone]

## 2023-09-27 NOTE — ED Notes (Signed)
Reviewed D/C information with the patient, pt verbalized understanding. No additional concerns at this time.

## 2023-09-27 NOTE — ED Notes (Signed)
Patient transported to CT 

## 2023-09-30 ENCOUNTER — Encounter (HOSPITAL_BASED_OUTPATIENT_CLINIC_OR_DEPARTMENT_OTHER): Payer: Self-pay | Admitting: Student

## 2023-09-30 ENCOUNTER — Ambulatory Visit (HOSPITAL_BASED_OUTPATIENT_CLINIC_OR_DEPARTMENT_OTHER): Payer: 59 | Admitting: Student

## 2023-09-30 DIAGNOSIS — M1711 Unilateral primary osteoarthritis, right knee: Secondary | ICD-10-CM | POA: Diagnosis not present

## 2023-09-30 DIAGNOSIS — M25561 Pain in right knee: Secondary | ICD-10-CM

## 2023-09-30 MED ORDER — HYDROCODONE-ACETAMINOPHEN 5-325 MG PO TABS: 1.0000 | ORAL_TABLET | Freq: Four times a day (QID) | ORAL | 0 refills | Status: AC | PRN

## 2023-09-30 MED ORDER — LIDOCAINE HCL 1 % IJ SOLN
4.0000 mL | INTRAMUSCULAR | Status: AC | PRN
  Administered 2023-09-30: 4 mL

## 2023-09-30 MED ORDER — TRIAMCINOLONE ACETONIDE 40 MG/ML IJ SUSP
2.0000 mL | INTRAMUSCULAR | Status: AC | PRN
  Administered 2023-09-30: 2 mL via INTRA_ARTICULAR

## 2023-09-30 NOTE — Progress Notes (Signed)
Chief Complaint: Right knee pain     History of Present Illness:    Kathleen Orr is a pleasant 67 y.o. female here today for evaluation of right knee pain.  Reports that 4 days ago she sustained a fall directly onto the right knee after missing a step going up concrete stairs.  She was seen the next day in the ED for evaluation.  X-rays and CT scan did not show any evidence of fracture but she was placed in a knee immobilizer.  Reports that her knee feels swollen and continues to have moderate to severe pain.  She has tried Voltaren, Tylenol, and tizanidine.  Denies any previous history of knee issues or injury.  Surgical History:   None  PMH/PSH/Family History/Social History/Meds/Allergies:    Past Medical History:  Diagnosis Date   Arthritis    Asthma    COPD (chronic obstructive pulmonary disease) (HCC)    Diabetes mellitus without complication (HCC)    Hepatitis C test positive    Hypertension    Past Surgical History:  Procedure Laterality Date   ABDOMINAL HYSTERECTOMY     CHOLECYSTECTOMY N/A 02/05/2022   Procedure: LAPAROSCOPIC CHOLECYSTECTOMY WITH INTRAOPERATIVE CHOLANGIOGRAM;  Surgeon: Darnell Level, MD;  Location: [facility];  Service: General;  Laterality: N/A;   TUBAL LIGATION     Social History   Socioeconomic History   Marital status: Divorced    Spouse name: Not on file   Number of children: Not on file   Years of education: Not on file   Highest education level: Not on file  Occupational History   Not on file  Tobacco Use   Smoking status: Former    Types: Cigarettes   Smokeless tobacco: Never  Vaping Use   Vaping status: Never Used  Substance and Sexual Activity   Alcohol use: Never   Drug use: Not Currently   Sexual activity: Not on file  Other Topics Concern   Not on file  Social History Narrative   Not on file   Social Drivers of Health   Financial Resource Strain: Not on file  Food Insecurity: Not on file   Transportation Needs: Not on file  Physical Activity: Not on file  Stress: Not on file  Social Connections: Not on file   Family History  Problem Relation Age of Onset   Heart failure Mother    Hypertension Mother    CAD Mother    Cancer Father    COPD Sister    Allergies  Allergen Reactions   Gabapentin Other (See Comments)    The patient did NOT like the way it made her feel AND it made her legs hurt   Current Outpatient Medications  Medication Sig Dispense Refill   HYDROcodone-acetaminophen (NORCO/VICODIN) 5-325 MG tablet Take 1 tablet by mouth every 6 (six) hours as needed for up to 3 days for moderate pain (pain score 4-6). 12 tablet 0   acetaminophen (TYLENOL) 500 MG tablet Take 2 tablets (1,000 mg total) by mouth every 6 (six) hours as needed for mild pain or fever. 30 tablet 0   albuterol (VENTOLIN HFA) 108 (90 Base) MCG/ACT inhaler Inhale 2 puffs into the lungs every 6 (six) hours as needed for wheezing or shortness of breath.     amoxicillin-clavulanate (AUGMENTIN) 875-125 MG tablet Take 1 tablet by  mouth every 12 (twelve) hours. 14 tablet 0   diclofenac Sodium (VOLTAREN) 1 % GEL Apply 4 g topically 4 (four) times daily. 100 g 0   docusate sodium (COLACE) 100 MG capsule Take 1 capsule (100 mg total) by mouth daily as needed for mild constipation.     doxycycline (VIBRAMYCIN) 100 MG capsule Take 1 capsule (100 mg total) by mouth 2 (two) times daily. 20 capsule 0   fluticasone (FLONASE) 50 MCG/ACT nasal spray Place 1-2 sprays into both nostrils daily as needed for allergies or rhinitis.     fluticasone furoate-vilanterol (BREO ELLIPTA) 100-25 MCG/ACT AEPB Inhale 1 puff into the lungs at bedtime.     hydrochlorothiazide (HYDRODIURIL) 25 MG tablet Take 25 mg by mouth daily. (Patient not taking: Reported on 02/05/2022)     ibuprofen (ADVIL) 800 MG tablet Take 800 mg by mouth daily as needed for headache or mild pain.     lisinopril (ZESTRIL) 20 MG tablet Take 20 mg by mouth  daily.     loperamide (IMODIUM) 2 MG capsule Take 1 capsule (2 mg total) by mouth 4 (four) times daily as needed for diarrhea or loose stools. 12 capsule 0   NON FORMULARY Take 1 capsule by mouth See admin instructions. OmegaXL Joint Pain Relief & Inflammation Supplement capsules- Take 1 capsule by mouth in the morning and evening     omeprazole (PRILOSEC) 20 MG capsule Take 1 capsule (20 mg total) by mouth 2 (two) times daily before a meal. (Patient taking differently: Take 20 mg by mouth daily before breakfast.) 30 capsule 0   ondansetron (ZOFRAN-ODT) 4 MG disintegrating tablet Take 1 tablet (4 mg total) by mouth every 8 (eight) hours as needed for nausea or vomiting. 20 tablet 0   polyethylene glycol (MIRALAX / GLYCOLAX) 17 g packet Take 17 g by mouth daily as needed for mild constipation.     tiZANidine (ZANAFLEX) 4 MG tablet Take 4 mg by mouth 3 (three) times daily as needed for muscle spasms.     No current facility-administered medications for this visit.   No results found.  Review of Systems:   A ROS was performed including pertinent positives and negatives as documented in the HPI.  Physical Exam :   Constitutional: NAD and appears stated age Neurological: Alert and oriented Psych: Appropriate affect and cooperative There were no vitals taken for this visit.   Comprehensive Musculoskeletal Exam:    Right knee exam demonstrates active range of motion from 10 to 90 degrees.  Mild effusion present.  No evidence of overlying erythema or ecchymosis.  Tenderness to palpation over the medial joint line.  No instability with varus or valgus stress.  Imaging:   Xray review from 09/26/2023 (right knee 4 views): No evidence of fracture or dislocation.  Mild tricompartmental degenerative changes most notable in the medial compartment.   I personally reviewed and interpreted the radiographs.   Assessment:   67 y.o. female with acute right knee pain after a direct fall 4 days ago.   Imaging including x-ray and CT scan do not show any evidence of fracture but does demonstrate tricompartmental osteoarthritis with osteophytes and joint space narrowing in the medial compartment.  I believe this is a contributor to her pain as well as a likely contusion based off her mechanism.  I have offered an aspiration and cortisone injection which I believe will help her tremendously.  Unfortunately was not able to locate any fluid in the suprapatellar pouch but did not successfully  perform cortisone injection and she tolerated this well.  I will send a few days of hydrocodone to help with severe pain particularly until injection takes effect.  Can plan to see her back for this knee as needed.  Plan :    - Cortisone injection performed today of the right knee - Start Norco 5-325 as needed - Return to clinic as needed     Procedure Note  Patient: Sabella Yates             Date of Birth: 1956-02-06           MRN: [id number]             Visit Date: 09/30/2023  Procedures: Visit Diagnoses:  1. Unilateral primary osteoarthritis, right knee     Large Joint Inj: R knee on 09/30/2023 3:10 PM Indications: pain Details: 22 G 1.5 in needle, anterolateral approach Medications: 4 mL lidocaine 1 %; 2 mL triamcinolone acetonide 40 MG/ML Aspirate: 0 mL Outcome: tolerated well, no immediate complications Procedure, treatment alternatives, risks and benefits explained, specific risks discussed. Consent was given by the patient. Immediately prior to procedure a time out was called to verify the correct patient, procedure, equipment, support staff and site/side marked as required. Patient was prepped and draped in the usual sterile fashion.      I personally saw and evaluated the patient, and participated in the management and treatment plan.  Hazle Nordmann, PA-C Orthopedics

## 2023-10-11 ENCOUNTER — Ambulatory Visit (HOSPITAL_BASED_OUTPATIENT_CLINIC_OR_DEPARTMENT_OTHER): Payer: 59 | Admitting: Student

## 2023-12-23 ENCOUNTER — Ambulatory Visit (INDEPENDENT_AMBULATORY_CARE_PROVIDER_SITE_OTHER): Admitting: Podiatry

## 2023-12-23 DIAGNOSIS — L84 Corns and callosities: Secondary | ICD-10-CM

## 2023-12-23 DIAGNOSIS — M21621 Bunionette of right foot: Secondary | ICD-10-CM

## 2023-12-23 DIAGNOSIS — E1140 Type 2 diabetes mellitus with diabetic neuropathy, unspecified: Secondary | ICD-10-CM | POA: Diagnosis not present

## 2023-12-23 DIAGNOSIS — M2041 Other hammer toe(s) (acquired), right foot: Secondary | ICD-10-CM | POA: Diagnosis not present

## 2023-12-23 DIAGNOSIS — M21622 Bunionette of left foot: Secondary | ICD-10-CM

## 2023-12-23 DIAGNOSIS — E1149 Type 2 diabetes mellitus with other diabetic neurological complication: Secondary | ICD-10-CM

## 2023-12-23 DIAGNOSIS — M2042 Other hammer toe(s) (acquired), left foot: Secondary | ICD-10-CM

## 2023-12-23 NOTE — Progress Notes (Signed)
 Subjective:   Patient ID: Kathleen Orr, female   DOB: 68 y.o.   MRN: [id number]   HPI Patient presents stating she has very painful corns on the second digits of both feet knows she has poor foot structure and has some thick toenails.  Patient states they get sore she does have long-term diabetes but she keeps well-controlled currently.  Patient does not smoke likes to be active   Review of Systems  All other systems reviewed and are negative.       Objective:  Physical Exam Vitals and nursing note reviewed.  Constitutional:      Appearance: She is well-developed.  Pulmonary:     Effort: Pulmonary effort is normal.  Musculoskeletal:        General: Normal range of motion.  Skin:    General: Skin is warm.  Neurological:     Mental Status: She is alert.     Neurovascular status intact muscle strength was found to be adequate range of motion adequate mild reduction in sharp dull vibratory with testing with patient found to have moderate structural bunion deformity deviation of the hallux against the second digit with distal keratotic lesion bilateral that is painful.  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  At this point I debrided digit to distal medial bilateral with sharp sterile instrumentation no iatrogenic bleeding applied pushes discussed possible surgery at 1 point in future but organ to try to avoid.  Patient does have mild risk factors with her diabetes and did tolerate the treatment well and will be seen back when symptomatic

## 2024-08-17 ENCOUNTER — Encounter: Payer: Self-pay | Admitting: Radiology

## 2024-10-06 ENCOUNTER — Encounter (HOSPITAL_BASED_OUTPATIENT_CLINIC_OR_DEPARTMENT_OTHER): Payer: Self-pay

## 2024-10-06 ENCOUNTER — Emergency Department (HOSPITAL_BASED_OUTPATIENT_CLINIC_OR_DEPARTMENT_OTHER)

## 2024-10-06 ENCOUNTER — Emergency Department (HOSPITAL_BASED_OUTPATIENT_CLINIC_OR_DEPARTMENT_OTHER)
Admission: EM | Admit: 2024-10-06 | Discharge: 2024-10-06 | Disposition: A | Discharge status: Home / Self Care | Attending: Emergency Medicine | Admitting: Emergency Medicine

## 2024-10-06 ENCOUNTER — Other Ambulatory Visit: Payer: Self-pay

## 2024-10-06 DIAGNOSIS — J101 Influenza due to other identified influenza virus with other respiratory manifestations: Secondary | ICD-10-CM | POA: Diagnosis not present

## 2024-10-06 DIAGNOSIS — R059 Cough, unspecified: Secondary | ICD-10-CM | POA: Diagnosis present

## 2024-10-06 DIAGNOSIS — J449 Chronic obstructive pulmonary disease, unspecified: Secondary | ICD-10-CM | POA: Insufficient documentation

## 2024-10-06 LAB — BASIC METABOLIC PANEL WITH GFR
Anion gap: 15 (ref 5–15)
BUN: 18 mg/dL (ref 8–23)
CO2: 23 mmol/L (ref 22–32)
Calcium: 9.2 mg/dL (ref 8.9–10.3)
Chloride: 98 mmol/L (ref 98–111)
Creatinine, Ser: 1.38 mg/dL — ABNORMAL HIGH (ref 0.44–1.00)
GFR, Estimated: 41 mL/min — ABNORMAL LOW
Glucose, Bld: 131 mg/dL — ABNORMAL HIGH (ref 70–99)
Potassium: 3.8 mmol/L (ref 3.5–5.1)
Sodium: 136 mmol/L (ref 135–145)

## 2024-10-06 LAB — CBC
HCT: 44 % (ref 36.0–46.0)
Hemoglobin: 14.7 g/dL (ref 12.0–15.0)
MCH: 28.7 pg (ref 26.0–34.0)
MCHC: 33.4 g/dL (ref 30.0–36.0)
MCV: 85.9 fL (ref 80.0–100.0)
Platelets: 150 K/uL (ref 150–400)
RBC: 5.12 MIL/uL — ABNORMAL HIGH (ref 3.87–5.11)
RDW: 13.4 % (ref 11.5–15.5)
WBC: 6.9 K/uL (ref 4.0–10.5)
nRBC: 0 % (ref 0.0–0.2)

## 2024-10-06 LAB — RESP PANEL BY RT-PCR (RSV, FLU A&B, COVID)  RVPGX2
Influenza A by PCR: POSITIVE — AB
Influenza B by PCR: NEGATIVE
Resp Syncytial Virus by PCR: NEGATIVE
SARS Coronavirus 2 by RT PCR: NEGATIVE

## 2024-10-06 LAB — TROPONIN T, HIGH SENSITIVITY: Troponin T High Sensitivity: <15 ng/L (ref 0–19)

## 2024-10-06 MED ORDER — PREDNISONE 50 MG PO TABS
60.0000 mg | ORAL_TABLET | Freq: Once | ORAL | Status: AC
  Administered 2024-10-06: 60 mg via ORAL
  Filled 2024-10-06: qty 1

## 2024-10-06 MED ORDER — PREDNISONE 20 MG PO TABS: ORAL_TABLET | ORAL | 0 refills | Status: AC

## 2024-10-06 MED ORDER — IPRATROPIUM-ALBUTEROL 0.5-2.5 (3) MG/3ML IN SOLN
3.0000 mL | Freq: Once | RESPIRATORY_TRACT | Status: AC
  Administered 2024-10-06: 3 mL via RESPIRATORY_TRACT
  Filled 2024-10-06: qty 3

## 2024-10-06 MED ORDER — ACETAMINOPHEN 500 MG PO TABS
1000.0000 mg | ORAL_TABLET | Freq: Once | ORAL | Status: AC
  Administered 2024-10-06: 1000 mg via ORAL
  Filled 2024-10-06: qty 2

## 2024-10-06 MED ORDER — OSELTAMIVIR PHOSPHATE 75 MG PO CAPS: 75.0000 mg | ORAL_CAPSULE | Freq: Two times a day (BID) | ORAL | 0 refills | Status: AC

## 2024-10-06 MED ORDER — LACTATED RINGERS IV BOLUS
1000.0000 mL | Freq: Once | INTRAVENOUS | Status: AC
  Administered 2024-10-06: 1000 mL via INTRAVENOUS

## 2024-10-06 MED ORDER — OSELTAMIVIR PHOSPHATE 75 MG PO CAPS
75.0000 mg | ORAL_CAPSULE | Freq: Once | ORAL | Status: AC
  Administered 2024-10-06: 75 mg via ORAL
  Filled 2024-10-06: qty 1

## 2024-10-06 NOTE — ED Notes (Signed)
 Pt reports her breathing has improved since breathing treatments

## 2024-10-06 NOTE — ED Triage Notes (Signed)
 Pt reports headache, nausea, dizziness, fever, cough, runny nose, sore throat and she is also reporting middle chest pain which started Sunday night. She reports the chest pain is only when she coughs.

## 2024-10-06 NOTE — Discharge Instructions (Addendum)
 1.  Take ibuprofen  and Tylenol  per package instructions to control fever body aches and headache. 2.  Since you have COPD take prednisone  as prescribed.  Use your albuterol  inhaler every 2-4 hours as needed. 3.  You have been prescribed Tamiflu .  This is an antiviral medicine to try to lessen the severity of your flu symptoms. 4.  Try to see your family doctor for recheck in the next 3 to 5 days.  Return to the emergency department if you have worsening difficulty breathing, cannot stay hydrated or other concerning changes.

## 2024-10-06 NOTE — ED Provider Notes (Signed)
 " [place] [facility] Provider Note   CSN: [id number] Arrival date & time: 10/06/24  1907     Patient presents with: Chest Pain and Cough   Kathleen Orr is a 68 y.o. female.   HPI Patient reports she started getting sick on Saturday, 4 days ago.  She reports it really hit her on Sunday.  She reports she has had aching body all over, chills, cough, nausea, headache,.  Patient reports she has been trying to take a multisymptom cold medication but not feeling much better.  She reports her grandson was diagnosed with flu about 6 days ago.  Other family members have been sick as well.    Prior to Admission medications  Medication Sig Start Date End Date Taking? Authorizing Provider  oseltamivir  (TAMIFLU ) 75 MG capsule Take 1 capsule (75 mg total) by mouth every 12 (twelve) hours. 10/06/24  Yes Armenta Canning, MD  predniSONE  (DELTASONE ) 20 MG tablet 3 tabs po day one, then 2 po daily x 4 days 10/06/24  Yes Kerston Landeck, Canning, MD  acetaminophen  (TYLENOL ) 500 MG tablet Take 2 tablets (1,000 mg total) by mouth every 6 (six) hours as needed for mild pain or fever. 02/06/22   Vicci Burnard SAUNDERS, PA-C  albuterol  (VENTOLIN  HFA) 108 (90 Base) MCG/ACT inhaler Inhale 2 puffs into the lungs every 6 (six) hours as needed for wheezing or shortness of breath.    [provider]  amoxicillin -clavulanate (AUGMENTIN ) 875-125 MG tablet Take 1 tablet by mouth every 12 (twelve) hours. 04/03/23   Roselyn Carlin NOVAK, MD  azithromycin (ZITHROMAX) 250 MG tablet Take 250 mg by mouth as directed. 10/11/23   [provider]  cyclobenzaprine (FLEXERIL) 10 MG tablet Take by mouth. 10/26/15   [provider]  diclofenac  Sodium (VOLTAREN ) 1 % GEL Apply 4 g topically 4 (four) times daily. 09/27/23   Griselda Norris, MD  docusate sodium  (COLACE) 100 MG capsule Take 1 capsule (100 mg total) by mouth daily as needed for mild constipation. 02/06/22   Vicci Burnard SAUNDERS, PA-C   doxycycline  (VIBRAMYCIN ) 100 MG capsule Take 1 capsule (100 mg total) by mouth 2 (two) times daily. 08/05/23   Piontek, Rocky, MD  fluticasone (FLONASE) 50 MCG/ACT nasal spray Place 1-2 sprays into both nostrils daily as needed for allergies or rhinitis.    [provider]  fluticasone furoate-vilanterol (BREO ELLIPTA) 100-25 MCG/ACT AEPB Inhale 1 puff into the lungs at bedtime. 11/10/21   [provider]  glipiZIDE (GLUCOTROL) 5 MG tablet Take by mouth.    [provider]  hydrochlorothiazide  (HYDRODIURIL ) 25 MG tablet Take 25 mg by mouth daily. Patient not taking: Reported on 02/05/2022    [provider]  ibuprofen  (ADVIL ) 800 MG tablet Take 800 mg by mouth daily as needed for headache or mild pain.    [provider]  lisinopril (ZESTRIL) 20 MG tablet Take 20 mg by mouth daily. 12/08/21   [provider]  loperamide  (IMODIUM ) 2 MG capsule Take 1 capsule (2 mg total) by mouth 4 (four) times daily as needed for diarrhea or loose stools. 09/17/23   Theadore Ozell HERO, MD  losartan (COZAAR) 100 MG tablet Take by mouth.    [provider]  methylPREDNISolone  (MEDROL  DOSEPAK) 4 MG TBPK tablet Take by mouth as directed. 10/11/23   [provider]  montelukast (SINGULAIR) 10 MG tablet Take 10 mg by mouth daily. 08/27/23   [provider]  MYRBETRIQ 25 MG TB24 tablet Take  25 mg by mouth every morning. 12/17/23   [provider]  NON FORMULARY Take 1 capsule by mouth See admin instructions. OmegaXL Joint Pain Relief & Inflammation Supplement capsules- Take 1 capsule by mouth in the morning and evening    [provider]  omega-3 acid ethyl esters (LOVAZA) 1 g capsule Take by mouth.    [provider]  omeprazole  (PRILOSEC) 20 MG capsule Take 1 capsule (20 mg total) by mouth 2 (two) times daily before a meal. Patient taking differently: Take 20 mg by mouth daily before breakfast. 05/21/20   Nivia Colon, PA-C   Omeprazole  Magnesium (PRILOSEC PO)     [provider]  ondansetron  (ZOFRAN -ODT) 4 MG disintegrating tablet Take 1 tablet (4 mg total) by mouth every 8 (eight) hours as needed for nausea or vomiting. 09/17/23   Bero, Michael M, MD  polyethylene glycol (MIRALAX  / GLYCOLAX ) 17 g packet Take 17 g by mouth daily as needed for mild constipation. 02/06/22   Vicci Burnard SAUNDERS, PA-C  sucralfate  (CARAFATE ) 1 g tablet TAKE 1 TABLET BY MOUTH 4 TIMES DAILY WITH MEALS AND AT BEDTIME 05/22/20   [provider]  tiZANidine  (ZANAFLEX ) 4 MG tablet Take 4 mg by mouth 3 (three) times daily as needed for muscle spasms. 12/14/21   [provider]    Allergies: Aspirin and Gabapentin    Review of Systems  Updated Vital Signs BP 108/79   Pulse 78   Temp 99.2 F (37.3 C) (Oral)   Resp (!) 21   Ht 5' 5 (1.651 m)   Wt 83.9 kg   SpO2 92%   BMI 30.79 kg/m   Physical Exam Constitutional:      Comments: Patient is alert.  Uncomfortable in appearance.  Mild increased work of breathing.  HENT:     Mouth/Throat:     Pharynx: Oropharynx is clear.  Cardiovascular:     Rate and Rhythm: Regular rhythm. Tachycardia present.  Pulmonary:     Comments: Mild tachypnea.  Diffuse expiratory wheeze.  Breath sounds slightly soft at the bases. Abdominal:     General: There is no distension.     Palpations: Abdomen is soft.     Tenderness: There is no guarding.     Comments: Patient reports her abdomen diffusely feels mildly uncomfortable to palpation.  No localizing pain.  Musculoskeletal:        General: No swelling or tenderness. Normal range of motion.     Right lower leg: No edema.     Left lower leg: No edema.  Skin:    General: Skin is warm and dry.  Neurological:     General: No focal deficit present.     Mental Status: She is oriented to person, place, and time.     Motor: No weakness.     Coordination: Coordination normal.  Psychiatric:        Mood and Affect: Mood normal.      (all labs ordered are listed, but only abnormal results are displayed) Labs Reviewed  RESP PANEL BY RT-PCR (RSV, FLU A&B, COVID)  [id number] - Abnormal; Notable for the following components:      Result Value   Influenza A by PCR POSITIVE (*)    All other components within normal limits  BASIC METABOLIC PANEL WITH GFR - Abnormal; Notable for the following components:   Glucose, Bld 131 (*)    Creatinine, Ser 1.38 (*)    GFR, Estimated 41 (*)    All other  components within normal limits  CBC - Abnormal; Notable for the following components:   RBC 5.12 (*)    All other components within normal limits  TROPONIN T, HIGH SENSITIVITY    EKG: EKG Interpretation Date/Time:  Tuesday October 06 2024 19:20:30 EST Ventricular Rate:  100 PR Interval:  150 QRS Duration:  85 QT Interval:  325 QTC Calculation: 420 R Axis:   -49  Text Interpretation: Sinus tachycardia Left anterior fascicular block Consider left ventricular hypertrophy Confirmed by Bari Kimberlynn Lumbra ([id number]) on 10/07/2024 3:31:05 PM  Radiology: DG Chest Portable 1 View Result Date: 10/06/2024 EXAM: 1 VIEW(S) XRAY OF THE CHEST 10/06/2024 07:47:00 PM COMPARISON: 08/05/2023 CLINICAL HISTORY: cp FINDINGS: LUNGS AND PLEURA: No focal pulmonary opacity. No pleural effusion. No pneumothorax. HEART AND MEDIASTINUM: Aortic atherosclerosis. No acute abnormality of the cardiac silhouette. BONES AND SOFT TISSUES: No acute osseous abnormality. IMPRESSION: 1. No acute cardiopulmonary abnormality. Electronically signed by: Norman Gatlin MD 10/06/2024 08:45 PM EST RP Workstation: [id number]     Procedures   Medications Ordered in the ED  lactated ringers  bolus 1,000 mL (0 mLs Intravenous Stopped 10/06/24 2343)  ipratropium-albuterol  (DUONEB) 0.5-2.5 (3) MG/3ML nebulizer solution 3 mL (3 mLs Nebulization Given 10/06/24 2008)  acetaminophen  (TYLENOL ) tablet 1,000 mg (1,000 mg Oral Given 10/06/24 2004)  ipratropium-albuterol  (DUONEB)  0.5-2.5 (3) MG/3ML nebulizer solution 3 mL (3 mLs Nebulization Given 10/06/24 2115)  predniSONE  (DELTASONE ) tablet 60 mg (60 mg Oral Given 10/06/24 2123)  oseltamivir  (TAMIFLU ) capsule 75 mg (75 mg Oral Given 10/06/24 2336)                                    Medical Decision Making Amount and/or Complexity of Data Reviewed Labs: ordered. Radiology: ordered.  Risk OTC drugs. Prescription drug management.   Patient presents as outlined.  She does present with low-grade fever 99.2 and heart rate of 102.  Blood pressure is soft at 101 systolic and 1 measurement at 86 systolic.  Symptoms are highly suspicious for influenza.  Will proceed with diagnostic evaluation for sepsis rule out and initiate a fluid bolus with lactated Ringer 's and DuoNeb for wheezing.  Patient does test positive influenza A positive.  Chest x-ray no focal consolidations.  Labs and EKG no acute significant finding.  Patient where she does not feel that her COPD is significantly worse than baseline.  She does however have comorbid issue at this time of influenza.  Patient is alert and she does not have respiratory distress but she does have wheezing present.  Currently I will opt to treat for influenza with Tamiflu  and prednisone  and increased use of albuterol  for COPD exacerbation in the setting of influenza.  At this time I do not feel the patient needs admission for influenza.  She does not have any respiratory distress and is maintaining oxygen  saturations without difficulty.  Reviewed home treatment plan and careful return precautions.  Patient voiced understanding.     Final diagnoses:  Influenza A  Chronic obstructive pulmonary disease, unspecified COPD type [facility])    ED Discharge Orders          Ordered    predniSONE  (DELTASONE ) 20 MG tablet        10/06/24 2328    oseltamivir  (TAMIFLU ) 75 MG capsule  Every 12 hours        10/06/24 2328               Chistopher Mangino,  [place], MD 10/07/24 1547  "
